# Patient Record
Sex: Female | Born: 1975 | Hispanic: No | Marital: Single | Smoking: Former smoker
Health system: Southern US, Community
[De-identification: ages and names within clinical notes are randomized; demographics above are authoritative.]

## PROBLEM LIST (undated history)

## (undated) ENCOUNTER — Inpatient Hospital Stay (HOSPITAL_COMMUNITY): Payer: Self-pay

## (undated) DIAGNOSIS — O009 Unspecified ectopic pregnancy without intrauterine pregnancy: Secondary | ICD-10-CM

## (undated) HISTORY — PX: AUGMENTATION MAMMAPLASTY: SUR837

## (undated) HISTORY — DX: Unspecified ectopic pregnancy without intrauterine pregnancy: O00.90

## (undated) HISTORY — PX: INTRAUTERINE DEVICE (IUD) INSERTION: SHX5877

---

## 2015-08-08 ENCOUNTER — Encounter: Payer: Self-pay | Admitting: Certified Nurse Midwife

## 2015-08-08 ENCOUNTER — Ambulatory Visit (INDEPENDENT_AMBULATORY_CARE_PROVIDER_SITE_OTHER): Payer: BLUE CROSS/BLUE SHIELD | Admitting: Certified Nurse Midwife

## 2015-08-08 VITALS — BP 102/60 | HR 84 | Resp 16 | Ht 65.0 in | Wt 163.0 lb

## 2015-08-08 DIAGNOSIS — Z124 Encounter for screening for malignant neoplasm of cervix: Secondary | ICD-10-CM | POA: Diagnosis not present

## 2015-08-08 DIAGNOSIS — Z0000 Encounter for general adult medical examination without abnormal findings: Secondary | ICD-10-CM | POA: Diagnosis not present

## 2015-08-08 DIAGNOSIS — Z30432 Encounter for removal of intrauterine contraceptive device: Secondary | ICD-10-CM | POA: Diagnosis not present

## 2015-08-08 DIAGNOSIS — Z01419 Encounter for gynecological examination (general) (routine) without abnormal findings: Secondary | ICD-10-CM | POA: Diagnosis not present

## 2015-08-08 DIAGNOSIS — N912 Amenorrhea, unspecified: Secondary | ICD-10-CM | POA: Diagnosis not present

## 2015-08-08 LAB — POCT URINE PREGNANCY: PREG TEST UR: NEGATIVE

## 2015-08-08 NOTE — Patient Instructions (Addendum)

## 2015-08-08 NOTE — Progress Notes (Signed)
40 y.o. G1P1001 Single  African American Fe here to establish gyn care and  for annual exam. Also would like to discuss having Mirena removed to try and get pregnant again.Blended family 38 year old(hers),38 year old, 38 year old.  Mirena inserted 2009 due for removal 2014. Patient had C -section with last pregnancy and anemia otherwise no issues. Sees Urgent care if needed. No health issues today. Fiancee with patient, patient verbally consented to his presence for all conversation, management and visit. Planning marriage soon.  No LMP recorded. Patient is not currently having periods (Reason: IUD).          Sexually active: Yes.    The current method of family planning is IUD.    Exercising: Yes.    Walk Smoker:  no  Health Maintenance: Pap:  05/2014-WNL per pt , no abnormal pap smear history MMG:  Never Colonoscopy:  Never BMD:   Never TDaP:  2002 Shingles: Never Pneumonia: Never HIV: 2016-neg Labs: Per pt does not want any.    reports that she has been smoking Cigarettes.  She has a 1.5 pack-year smoking history. She has never used smokeless tobacco. She reports that she drinks about 0.6 - 1.2 oz of alcohol per week. She reports that she does not use illicit drugs.  No past medical history on file.  Past Surgical History  Procedure Laterality Date  . Augmentation mammaplasty    . Cesarean section      Current Outpatient Prescriptions  Medication Sig Dispense Refill  . levonorgestrel (MIRENA) 20 MCG/24HR IUD 1 each by Intrauterine route once.     No current facility-administered medications for this visit.    Family History  Problem Relation Age of Onset  . Diabetes Mother   . Cancer Father     Prostate Cancer  . Prostate cancer Father   . Breast cancer Paternal Aunt   . Cancer Paternal Grandmother     Brain & Bone Cancer  . Brain cancer Paternal Grandmother   . Bone cancer Paternal Grandmother     ROS:  Pertinent items are noted in HPI.  Otherwise, a comprehensive  ROS was negative.  Exam:   BP 102/60 mmHg  Pulse 84  Resp 16  Ht  (1.651 m)  Wt 163 lb (73.936 kg)  BMI 27.12 kg/m2 Height:  (165.1 cm) Ht Readings from Last 3 Encounters:  08/08/15  (1.651 m)    General appearance: alert, cooperative and appears stated age Head: Normocephalic, without obvious abnormality, atraumatic Neck: no adenopathy, supple, symmetrical, trachea midline and thyroid normal to inspection and palpation Lungs: clear to auscultation bilaterally Breasts: normal appearance, no masses or tenderness, No nipple retraction or dimpling, No nipple discharge or bleeding, No axillary or supraclavicular adenopathy, saline implants bilateral feel intact Heart: regular rate and rhythm Abdomen: soft, non-tender; no masses,  no organomegaly Extremities: extremities normal, atraumatic, no cyanosis or edema Skin: Skin color, texture, turgor normal. No rashes or lesions Lymph nodes: Cervical, supraclavicular, and axillary nodes normal. No abnormal inguinal nodes palpated Neurologic: Grossly normal   Pelvic: External genitalia:  no lesions              Urethra:  normal appearing urethra with no masses, tenderness or lesions              Bartholin's and Skene's: normal                 Vagina: normal appearing vagina with normal color and discharge, no  lesions              Cervix: no cervical motion tenderness, no lesions and normal appearance with IUD noted in cervical os              Pap taken: Yes.   Bimanual Exam:  Uterus:  normal size, contour, position, consistency, mobility, non-tender              Adnexa: normal adnexa and no mass, fullness, tenderness               Rectovaginal: Confirms               Anus:  normal sphincter tone, no lesions  Chaperone present: yes  A:  Well Woman with normal exam  Contraception expired Mirena IUD due for removal 2014, plan to schedule for removal to try for pregnancy  History of anemia  P:   Reviewed health and wellness  pertinent to exam  Discussed condom use with IUD past due for removal Negative UPT today and will need to come in two weeks for serum HCG if negative will remove IUD. She will be notified of insurance info and scheduled as above.  Discussed importance of iron in diet with history of anemia.  Encouraged to start on Prenatal vitamins now to prepare for pregnancy. Questions addressed.  Pap smear as above with HPVHR   counseled on breast self exam, adequate intake of calcium and vitamin D, diet and exercise  return annually or prn  An After Visit Summary was printed and given to the patient.

## 2015-08-09 LAB — CBC
HCT: 39.4 % (ref 35.0–45.0)
Hemoglobin: 12.7 g/dL (ref 11.7–15.5)
MCH: 28.4 pg (ref 27.0–33.0)
MCHC: 32.2 g/dL (ref 32.0–36.0)
MCV: 88.1 fL (ref 80.0–100.0)
MPV: 10.5 fL (ref 7.5–12.5)
PLATELETS: 257 10*3/uL (ref 140–400)
RBC: 4.47 MIL/uL (ref 3.80–5.10)
RDW: 13.6 % (ref 11.0–15.0)
WBC: 7.2 10*3/uL (ref 3.8–10.8)

## 2015-08-09 LAB — IPS PAP TEST WITH HPV

## 2015-08-09 LAB — RUBELLA SCREEN: RUBELLA: 2.22 {index} — AB (ref <0.90)

## 2015-08-09 NOTE — Progress Notes (Signed)
Reviewed personally.  M. Suzanne Slaton Reaser, MD.  

## 2015-08-22 ENCOUNTER — Ambulatory Visit (INDEPENDENT_AMBULATORY_CARE_PROVIDER_SITE_OTHER): Payer: BLUE CROSS/BLUE SHIELD | Admitting: Certified Nurse Midwife

## 2015-08-22 ENCOUNTER — Encounter: Payer: Self-pay | Admitting: Certified Nurse Midwife

## 2015-08-22 VITALS — BP 112/80 | HR 72 | Resp 16 | Ht 65.0 in | Wt 163.0 lb

## 2015-08-22 DIAGNOSIS — Z30432 Encounter for removal of intrauterine contraceptive device: Secondary | ICD-10-CM

## 2015-08-22 DIAGNOSIS — N912 Amenorrhea, unspecified: Secondary | ICD-10-CM | POA: Diagnosis not present

## 2015-08-22 LAB — POCT URINE PREGNANCY: Preg Test, Ur: NEGATIVE

## 2015-08-22 NOTE — Progress Notes (Signed)
40 y.o. Married African American G1P1001here for IUD removal to attempt pregnancy, but did not come in for follow up HCG qualitative due to expired MIrena IUD. Patient missed appointment. Patient has also not been using alternative contraception until IUD removed. "Spouse does not want to use them". Last sexually activity this am. Patient request we have discussion with spouse regarding this, he is with her today. No other health concerns. Spouse present for discussion after initial interview with patient, per patient verbal request.  O: Healthy female, WD WN Affect: normal orientation X 3    A: Mirena IUD past due for removal due 02/26/12 Previous negative UPT on 08/08/15, due for repeat with HCG qualitative, did not keep appointment to assure no pregnancy prior to removal. Contraception none  P: Discussed with patient and spouse the concerns with Mirena IUD removal and possible pregnancy with no contraception use in the past two weeks. For removal to try for pregnancy will need serum HCG today and repeat in 2 weeks with stat HCG qualitative and if negative can remove later same day. They must have protection with condoms during sexual activity until IUD removed. This was stressed, along with how important it is for trust in provider patient/family relationship when reviewing history. Questions addressed. Patient and spouse agreed that they will follow plan to be able to have MIrena removed. Appointment will be scheduled as above prior to leaving today.  Lab: Serum HCG qualitative   20 minutes spent with patient with >50% of time spent in face to face counseling.  RV as above

## 2015-08-23 LAB — HCG, SERUM, QUALITATIVE: Preg, Serum: NEGATIVE

## 2015-08-23 NOTE — Progress Notes (Signed)
Reviewed personally.  M. Suzanne Carlene Bickley, MD.  

## 2015-09-07 ENCOUNTER — Ambulatory Visit (INDEPENDENT_AMBULATORY_CARE_PROVIDER_SITE_OTHER): Payer: BLUE CROSS/BLUE SHIELD | Admitting: Certified Nurse Midwife

## 2015-09-07 ENCOUNTER — Encounter: Payer: Self-pay | Admitting: Certified Nurse Midwife

## 2015-09-07 ENCOUNTER — Other Ambulatory Visit: Payer: BLUE CROSS/BLUE SHIELD

## 2015-09-07 VITALS — BP 116/64 | HR 68 | Resp 16 | Ht 65.0 in | Wt 162.0 lb

## 2015-09-07 DIAGNOSIS — Z30432 Encounter for removal of intrauterine contraceptive device: Secondary | ICD-10-CM

## 2015-09-07 DIAGNOSIS — N912 Amenorrhea, unspecified: Secondary | ICD-10-CM

## 2015-09-07 LAB — HCG, SERUM, QUALITATIVE: Preg, Serum: NEGATIVE

## 2015-09-07 NOTE — Patient Instructions (Signed)
Prenatal Care °WHAT IS PRENATAL CARE?  °Prenatal care is the process of caring for a pregnant woman before she gives birth. Prenatal care makes sure that she and her baby remain as healthy as possible throughout pregnancy. Prenatal care may be provided by a midwife, family practice health care provider, or a childbirth and pregnancy specialist (obstetrician). Prenatal care may include physical examinations, testing, treatments, and education on nutrition, lifestyle, and social support services. °WHY IS PRENATAL CARE SO IMPORTANT?  °Early and consistent prenatal care increases the chance that you and your baby will remain healthy throughout your pregnancy. This type of care also decreases a baby's risk of being born too early (prematurely), or being born smaller than expected (small for gestational age). Any underlying medical conditions you may have that could pose a risk during your pregnancy are discussed during prenatal care visits. You will also be monitored regularly for any new conditions that may arise during your pregnancy so they can be treated quickly and effectively. °WHAT HAPPENS DURING PRENATAL CARE VISITS? °Prenatal care visits may include the following: °Discussion °Tell your health care provider about any new signs or symptoms you have experienced since your last visit. These might include: °· Nausea or vomiting. °· Increased or decreased level of energy. °· Difficulty sleeping. °· Back or leg pain. °· Weight changes. °· Frequent urination. °· Shortness of breath with physical activity. °· Changes in your skin, such as the development of a rash or itchiness. °· Vaginal discharge or bleeding. °· Feelings of excitement or nervousness. °· Changes in your baby's movements. °You may want to write down any questions or topics you want to discuss with your health care provider and bring them with you to your appointment. °Examination °During your first prenatal care visit, you will likely have a complete  physical exam. Your health care provider will often examine your vagina, cervix, and the position of your uterus, as well as check your heart, lungs, and other body systems. As your pregnancy progresses, your health care provider will measure the size of your uterus and your baby's position inside your uterus. He or she may also examine you for early signs of labor. Your prenatal visits may also include checking your blood pressure and, after about 10-12 weeks of pregnancy, listening to your baby's heartbeat. °Testing °Regular testing often includes: °· Urinalysis. This checks your urine for glucose, protein, or signs of infection. °· Blood count. This checks the levels of white and red blood cells in your body. °· Tests for sexually transmitted infections (STIs). Testing for STIs at the beginning of pregnancy is routinely done and is required in many states. °· Antibody testing. You will be checked to see if you are immune to certain illnesses, such as rubella, that can affect a developing fetus. °· Glucose screen. Around 24-28 weeks of pregnancy, your blood glucose level will be checked for signs of gestational diabetes. Follow-up tests may be recommended. °· Group B strep. This is a bacteria that is commonly found inside a woman's vagina. This test will inform your health care provider if you need an antibiotic to reduce the amount of this bacteria in your body prior to labor and childbirth. °· Ultrasound. Many pregnant women undergo an ultrasound screening around 18-20 weeks of pregnancy to evaluate the health of the fetus and check for any developmental abnormalities. °· HIV (human immunodeficiency virus) testing. Early in your pregnancy, you will be screened for HIV. If you are at high risk for HIV, this test   may be repeated during your third trimester of pregnancy. °You may be offered other testing based on your age, personal or family medical history, or other factors.  °HOW OFTEN SHOULD I PLAN TO SEE MY  HEALTH CARE PROVIDER FOR PRENATAL CARE? °Your prenatal care check-up schedule depends on any medical conditions you have before, or develop during, your pregnancy. If you do not have any underlying medical conditions, you will likely be seen for checkups: °· Monthly, during the first 6 months of pregnancy. °· Twice a month during months 7 and 8 of pregnancy. °· Weekly starting in the 9th month of pregnancy and until delivery. °If you develop signs of early labor or other concerning signs or symptoms, you may need to see your health care provider more often. Ask your health care provider what prenatal care schedule is best for you. °WHAT CAN I DO TO KEEP MYSELF AND MY BABY AS HEALTHY AS POSSIBLE DURING MY PREGNANCY? °· Take a prenatal vitamin containing 400 micrograms (0.4 mg) of folic acid every day. Your health care provider may also ask you to take additional vitamins such as iodine, vitamin D, iron, copper, and zinc. °· Take 1500-2000 mg of calcium daily starting at your 20th week of pregnancy until you deliver your baby. °· Make sure you are up to date on your vaccinations. Unless directed otherwise by your health care provider: °¨ You should receive a tetanus, diphtheria, and pertussis (Tdap) vaccination between the 27th and 36th week of your pregnancy, regardless of when your last Tdap immunization occurred. This helps protect your baby from whooping cough (pertussis) after he or she is born. °¨ You should receive an annual inactivated influenza vaccine (IIV) to help protect you and your baby from influenza. This can be done at any point during your pregnancy. °· Eat a well-rounded diet that includes: °¨ Fresh fruits and vegetables. °¨ Lean proteins. °¨ Calcium-rich foods such as milk, yogurt, hard cheeses, and dark, leafy greens. °¨ Whole grain breads. °· Do not eat seafood high in mercury, including: °¨ Swordfish. °¨ Tilefish. °¨ Shark. °¨ King mackerel. °¨ More than 6 oz tuna per week. °· Do not eat: °¨ Raw  or undercooked meats or eggs. °¨ Unpasteurized foods, such as soft cheeses (brie, blue, or feta), juices, and milks. °¨ Lunch meats. °¨ Hot dogs that have not been heated until they are steaming. °· Drink enough water to keep your urine clear or pale yellow. For many women, this may be 10 or more 8 oz glasses of water each day. Keeping yourself hydrated helps deliver nutrients to your baby and may prevent the start of pre-term uterine contractions. °· Do not use any tobacco products including cigarettes, chewing tobacco, or electronic cigarettes. If you need help quitting, ask your health care provider. °· Do not drink beverages containing alcohol. No safe level of alcohol consumption during pregnancy has been determined. °· Do not use any illegal drugs. These can harm your developing baby or cause a miscarriage. °· Ask your health care provider or pharmacist before taking any prescription or over-the-counter medicines, herbs, or supplements. °· Limit your caffeine intake to no more than 200 mg per day. °· Exercise. Unless told otherwise by your health care provider, try to get 30 minutes of moderate exercise most days of the week. Do not  do high-impact activities, contact sports, or activities with a high risk of falling, such as horseback riding or downhill skiing. °· Get plenty of rest. °· Avoid anything that raises your   body temperature, such as hot tubs and saunas. °· If you own a cat, do not empty its litter box. Bacteria contained in cat feces can cause an infection called toxoplasmosis. This can result in serious harm to the fetus. °· Stay away from chemicals such as insecticides, lead, mercury, and cleaning or paint products that contain solvents. °· Do not have any X-rays taken unless medically necessary. °· Take a childbirth and breastfeeding preparation class. Ask your health care provider if you need a referral or recommendation. °  °This information is not intended to replace advice given to you by  your health care provider. Make sure you discuss any questions you have with your health care provider. °  °Document Released: 02/14/2003 Document Revised: 03/04/2014 Document Reviewed: 04/28/2013 °Elsevier Interactive Patient Education ©2016 [employer] ° °

## 2015-09-07 NOTE — Progress Notes (Signed)
40 yrs African American Single G1P1001 female presents for Mirena IUD removal.  Plans for contraception are  none, plans to try for pregnancy  LMP none in 3 years    2 serum HCG 2 weeks apart negative, with spouse with condom use.    HPI neg.  Exam: Healthy WDWN female Abdomen: soft non-tender Groin:no inguinal nodes palpated    Pelvic exam:Pelvic exam: VULVA: normal appearing vulva with no masses, tenderness or lesions, VAGINA: normal appearing vagina with normal color and discharge, no lesions, CERVIX: normal appearing cervix without discharge or lesions, IUD noted in cervical os, UTERUS: uterus is normal size, shape, consistency and nontender, ADNEXA: normal adnexa in size, nontender and no masses.  Procedure: Speculum placed, cervix visualized.  IUD string visualized, grasp with ring forceps, with gentle traction IUD removed intact.  IUD shown to patient and discarded. Speculum removed.   Assessment:Mirena removal Pt tolerated procedure well.  Plan: Continue prenatal vitamins and if no pregnancy in 6 months needs to advise.Has planning for pregnancy handout.    Questions addressed  Rv prn or positive pregnancy test

## 2015-09-11 NOTE — Progress Notes (Signed)
Encounter reviewed Jevonte Clanton, MD   

## 2016-02-26 NOTE — L&D Delivery Note (Signed)
Delivery Note SROM for blood tinged fluid. Cytotec loading dose given. Pt received CLE and felt pressure immediately after. A nonviable female infant Teacher, adult education) was noted at introitus, breech. Placenta delivered with gentle subrapubic pressure, intact. Bimanual exam with old scar intact and cervix already closing. No vaginal or cervical lacerations noted. Placenta to pathology - cultures taken and sent. Patient declines autopsy. No clinical evidence of maternal chorio thus abx discontinued. EBL 200cc. Plan for one more dose of Cytotec 3 hours after LD prophylactically. Chaplain at bedside, pt desires to hold [name] once she is dressed.    Ranae Pila 10/15/2016, 9:28 AM

## 2016-06-14 ENCOUNTER — Telehealth: Payer: Self-pay | Admitting: Certified Nurse Midwife

## 2016-06-14 NOTE — Telephone Encounter (Signed)
Patient called to report that she is pregnant. Patient declined a pregnancy confirmation appointment. I gave patient names and numbers of OBGYN's.

## 2016-07-09 ENCOUNTER — Ambulatory Visit: Payer: BLUE CROSS/BLUE SHIELD | Admitting: Certified Nurse Midwife

## 2016-07-27 ENCOUNTER — Inpatient Hospital Stay (HOSPITAL_COMMUNITY)
Admission: AD | Admit: 2016-07-27 | Discharge: 2016-07-27 | Disposition: A | Discharge status: Hospice | Payer: BLUE CROSS/BLUE SHIELD | Source: Ambulatory Visit | Attending: Obstetrics and Gynecology | Admitting: Obstetrics and Gynecology

## 2016-07-27 ENCOUNTER — Encounter (HOSPITAL_COMMUNITY): Payer: Self-pay

## 2016-07-27 DIAGNOSIS — Z3A10 10 weeks gestation of pregnancy: Secondary | ICD-10-CM | POA: Insufficient documentation

## 2016-07-27 DIAGNOSIS — Z833 Family history of diabetes mellitus: Secondary | ICD-10-CM | POA: Insufficient documentation

## 2016-07-27 DIAGNOSIS — O26891 Other specified pregnancy related conditions, first trimester: Secondary | ICD-10-CM | POA: Insufficient documentation

## 2016-07-27 DIAGNOSIS — R1030 Lower abdominal pain, unspecified: Secondary | ICD-10-CM | POA: Insufficient documentation

## 2016-07-27 DIAGNOSIS — O219 Vomiting of pregnancy, unspecified: Secondary | ICD-10-CM | POA: Diagnosis not present

## 2016-07-27 DIAGNOSIS — O34219 Maternal care for unspecified type scar from previous cesarean delivery: Secondary | ICD-10-CM | POA: Diagnosis not present

## 2016-07-27 DIAGNOSIS — R112 Nausea with vomiting, unspecified: Secondary | ICD-10-CM | POA: Diagnosis present

## 2016-07-27 LAB — BASIC METABOLIC PANEL
ANION GAP: 6 (ref 5–15)
BUN: 9 mg/dL (ref 6–20)
CO2: 27 mmol/L (ref 22–32)
Calcium: 8.7 mg/dL — ABNORMAL LOW (ref 8.9–10.3)
Chloride: 102 mmol/L (ref 101–111)
Creatinine, Ser: 0.64 mg/dL (ref 0.44–1.00)
GFR calc Af Amer: >60 mL/min (ref >60)
Glucose, Bld: 98 mg/dL (ref 65–99)
Potassium: 3.4 mmol/L — ABNORMAL LOW (ref 3.5–5.1)
SODIUM: 135 mmol/L (ref 135–145)

## 2016-07-27 LAB — URINALYSIS, ROUTINE W REFLEX MICROSCOPIC
Bilirubin Urine: NEGATIVE
GLUCOSE, UA: NEGATIVE mg/dL
HGB URINE DIPSTICK: NEGATIVE
Ketones, ur: NEGATIVE mg/dL
NITRITE: NEGATIVE
PH: 8 (ref 5.0–8.0)
PROTEIN: 30 mg/dL — AB
SPECIFIC GRAVITY, URINE: 1.027 (ref 1.005–1.030)

## 2016-07-27 LAB — CBC
HCT: 35 % — ABNORMAL LOW (ref 36.0–46.0)
HEMOGLOBIN: 11.9 g/dL — AB (ref 12.0–15.0)
MCH: 28.8 pg (ref 26.0–34.0)
MCHC: 34 g/dL (ref 30.0–36.0)
MCV: 84.7 fL (ref 78.0–100.0)
PLATELETS: 231 10*3/uL (ref 150–400)
RBC: 4.13 MIL/uL (ref 3.87–5.11)
RDW: 14.1 % (ref 11.5–15.5)
WBC: 8 10*3/uL (ref 4.0–10.5)

## 2016-07-27 LAB — POCT PREGNANCY, URINE: Preg Test, Ur: POSITIVE — AB

## 2016-07-27 MED ORDER — PROMETHAZINE HCL 25 MG/ML IJ SOLN
25.0000 mg | Freq: Once | INTRAMUSCULAR | Status: AC
  Administered 2016-07-27: 25 mg via INTRAVENOUS
  Filled 2016-07-27: qty 1

## 2016-07-27 MED ORDER — RANITIDINE HCL 150 MG PO TABS: 150.0000 mg | ORAL_TABLET | Freq: Two times a day (BID) | ORAL | 0 refills | Status: DC

## 2016-07-27 MED ORDER — LACTATED RINGERS IV BOLUS (SEPSIS)
1000.0000 mL | Freq: Once | INTRAVENOUS | Status: AC
  Administered 2016-07-27: 1000 mL via INTRAVENOUS

## 2016-07-27 MED ORDER — PROMETHAZINE HCL 25 MG PO TABS: 25.0000 mg | ORAL_TABLET | Freq: Four times a day (QID) | ORAL | 0 refills | Status: DC | PRN

## 2016-07-27 MED ORDER — FAMOTIDINE IN NACL 20-0.9 MG/50ML-% IV SOLN
20.0000 mg | Freq: Once | INTRAVENOUS | Status: AC
  Administered 2016-07-27: 20 mg via INTRAVENOUS
  Filled 2016-07-27: qty 50

## 2016-07-27 NOTE — MAU Provider Note (Signed)
History     CSN: [id number]  Arrival date and time: 07/27/16 1448  First Provider Initiated Contact with Patient 07/27/16 1539      Chief Complaint  Patient presents with  . Emesis  . Abdominal Pain   HPI  Monique Wood is a 41 y.o. G2P1001 at [redacted]w[redacted]d who presents with nausea & vomiting. Has been taking diclegis at home without relief. Symptoms have worsened in the last 4 days & pt hasn't taken diclegis since Thursday. Reports vomiting 6+ times today. Had orange juice, PB&J, & pancakes; all of it came up. Also reports lower abdominal pain that she states feels like soreness from vomiting. Was seen in office 2 weeks ago; had ultrasound that confirmed IUP & gave her EDD of 02/23/17.  Denies fever, vaginal bleeding, diarrhea, constipation. Last BM was this morning.   OB History    Gravida Para Term Preterm AB Living   2 1 1     1    SAB TAB Ectopic Multiple Live Births           1      History reviewed. No pertinent past medical history.  Past Surgical History:  Procedure Laterality Date  . AUGMENTATION MAMMAPLASTY    . CESAREAN SECTION    . INTRAUTERINE DEVICE (IUD) INSERTION      Family History  Problem Relation Age of Onset  . Diabetes Mother   . Cancer Father        Prostate Cancer  . Prostate cancer Father   . Breast cancer Paternal Aunt   . Cancer Paternal Grandmother        Brain & Bone Cancer  . Brain cancer Paternal Grandmother   . Bone cancer Paternal Grandmother     Social History  Substance Use Topics  . Smoking status: Never Smoker  . Smokeless tobacco: Never Used  . Alcohol use 0.6 - 1.2 oz/week    1 - 2 Standard drinks or equivalent per week     Comment: Wine    Allergies: No Known Allergies  Prescriptions Prior to Admission  Medication Sig Dispense Refill Last Dose  . DICLEGIS 10-10 MG TBEC      . [DISCONTINUED] levonorgestrel (MIRENA) 20 MCG/24HR IUD 1 each by Intrauterine route once. Reported on 09/07/2015   Not Taking    Review of Systems   Constitutional: Negative.   Gastrointestinal: Positive for abdominal pain, nausea and vomiting. Negative for constipation and diarrhea.  Genitourinary: Negative.    Physical Exam   Blood pressure 115/61, pulse 85, temperature 98.5 F (36.9 C), temperature source Oral, resp. rate 16, SpO2 99 %.  Physical Exam  Nursing note and vitals reviewed. Constitutional: She is oriented to person, place, and time. She appears well-developed and well-nourished. No distress.  HENT:  Head: Normocephalic and atraumatic.  Eyes: Conjunctivae are normal. Right eye exhibits no discharge. Left eye exhibits no discharge. No scleral icterus.  Neck: Normal range of motion.  Cardiovascular: Normal rate, regular rhythm and normal heart sounds.   No murmur heard. Respiratory: Effort normal and breath sounds normal. No respiratory distress. She has no wheezes.  GI: Soft. Bowel sounds are normal. She exhibits distension. There is no tenderness. There is no rebound and no guarding.  Neurological: She is alert and oriented to person, place, and time.  Skin: Skin is warm and dry. She is not diaphoretic.  Psychiatric: She has a normal mood and affect. Her behavior is normal. Judgment and thought content normal.    MAU Course  Procedures Results for orders placed or performed during the hospital encounter of 07/27/16 (from the past 24 hour(s))  Urinalysis, Routine w reflex microscopic     Status: Abnormal   Collection Time: 07/27/16  2:56 PM  Result Value Ref Range   Color, Urine YELLOW YELLOW   APPearance HAZY (A) CLEAR   Specific Gravity, Urine 1.027 1.005 - 1.030   pH 8.0 5.0 - 8.0   Glucose, UA NEGATIVE NEGATIVE mg/dL   Hgb urine dipstick NEGATIVE NEGATIVE   Bilirubin Urine NEGATIVE NEGATIVE   Ketones, ur NEGATIVE NEGATIVE mg/dL   Protein, ur 30 (A) NEGATIVE mg/dL   Nitrite NEGATIVE NEGATIVE   Leukocytes, UA TRACE (A) NEGATIVE   RBC / HPF 0-5 0 - 5 RBC/hpf   WBC, UA 0-5 0 - 5 WBC/hpf   Bacteria, UA  RARE (A) NONE SEEN   Squamous Epithelial / LPF 0-5 (A) NONE SEEN   Mucous PRESENT   Pregnancy, urine POC     Status: Abnormal   Collection Time: 07/27/16  3:14 PM  Result Value Ref Range   Preg Test, Ur POSITIVE (A) NEGATIVE  CBC     Status: Abnormal   Collection Time: 07/27/16  4:12 PM  Result Value Ref Range   WBC 8.0 4.0 - 10.5 K/uL   RBC 4.13 3.87 - 5.11 MIL/uL   Hemoglobin 11.9 (L) 12.0 - 15.0 g/dL   HCT 16.135.0 (L) 09.636.0 - 04.546.0 %   MCV 84.7 78.0 - 100.0 fL   MCH 28.8 26.0 - 34.0 pg   MCHC 34.0 30.0 - 36.0 g/dL   RDW 40.914.1 81.111.5 - 91.415.5 %   Platelets 231 150 - 400 K/uL  Basic metabolic panel     Status: Abnormal   Collection Time: 07/27/16  4:12 PM  Result Value Ref Range   Sodium 135 135 - 145 mmol/L   Potassium 3.4 (L) 3.5 - 5.1 mmol/L   Chloride 102 101 - 111 mmol/L   CO2 27 22 - 32 mmol/L   Glucose, Bld 98 65 - 99 mg/dL   BUN 9 6 - 20 mg/dL   Creatinine, Ser 7.820.64 0.44 - 1.00 mg/dL   Calcium 8.7 (L) 8.9 - 10.3 mg/dL   GFR calc non Af Amer >60 >60 mL/min   GFR calc Af Amer >60 >60 mL/min   Anion gap 6 5 - 15    MDM FHT 158 by informal bedside ultrasound IV LR bolus, phenergan 25 mg, pepcid 20 mg CBC, BMP Patient reports improvement in symptoms; not observed vomiting while in MAU S/w Dr. Rana SnareLowe. Ok to discharge home.  Assessment and Plan  A; 1. Nausea and vomiting during pregnancy prior to [redacted] weeks gestation    P: Discharge home Rx phenergan Keep scheduled appt with OB on Monday Discussed reasons to return to MAU  [name] 07/27/2016, 3:39 PM

## 2016-07-27 NOTE — MAU Note (Signed)
Ongoing vomiting, getting much worse.  Also having pain in lower abd, more to rt side.  At first was only when she vomited, now it is all the time

## 2016-07-27 NOTE — Progress Notes (Addendum)
G2P1 @ [redacted] wksga. Presents to triage for throwing up all day today. Doppler attempted unable to obtain fhr. Denies bleeding. C/o abdominal pain 4 days ago. States from vomiting. VSS see flow sheet for details.   1530: Provider aware of pt status.   1623: back from lunch. New orders received for labs and IV hydration and meds.  1640: IV started. LR bolus per order. Pt states labs came by earlier.   1648: Medicated per order.   1716: pt resting quietly. States phenergan helped with nausea.   1719: Provider at bs re-assessing pt.

## 2016-07-27 NOTE — Discharge Instructions (Signed)
Eating Plan for Nausea & vomiting in pregnancy  Hyperemesis gravidarum is a severe form of morning sickness. Because this condition causes severe nausea and vomiting, it can lead to dehydration, malnutrition, and weight loss. One way to lessen the symptoms of nausea and vomiting is to follow the eating plan for hyperemesis gravidarum. It is often used along with prescribed medicines to control your symptoms. What can I do to relieve my symptoms? Listen to your body. Everyone is different and has different preferences. Find what works best for you. Take any of the following actions that are helpful to you:  Eat and drink slowly.  Eat 5-6 small meals daily instead of 3 large meals.  Eat crackers before you get out of bed in the morning.  Try having a snack in the middle of the night.  Starchy foods are usually tolerated well. Examples include cereal, toast, bread, potatoes, pasta, rice, and pretzels.  Ginger may help with nausea. Add  tsp ground ginger to hot tea or choose ginger tea.  Try drinking 100% fruit juice or an electrolyte drink. An electrolyte drink contains sodium, potassium, and chloride.  Continue to take your prenatal vitamins as told by your health care provider. If you are having trouble taking your prenatal vitamins, talk with your health care provider about different options.  Include at least 1 serving of protein with your meals and snacks. Protein options include meats or poultry, beans, nuts, eggs, and yogurt. Try eating a protein-rich snack before bed. Examples of these snacks include cheese and crackers or half of a peanut butter or [place] sandwich.  Consider eliminating foods that trigger your symptoms. These may include spicy foods, coffee, high-fat foods, very sweet foods, and acidic foods.  Try meals that have more protein combined with bland, salty, lower-fat, and dry foods, such as nuts, seeds, pretzels, crackers, and cereal.  Talk with your healthcare  provider about starting a supplement of vitamin B6.  Have fluids that are cold, clear, and carbonated or sour. Examples include lemonade, ginger ale, lemon-lime soda, ice water, and sparkling water.  Try lemon or mint tea.  Try brushing your teeth or using a mouth rinse after meals.  What should I avoid to reduce my symptoms? Avoiding some of the following things may help reduce your symptoms.  Foods with strong smells. Try eating meals in well-ventilated areas that are free of odors.  Drinking water or other beverages with meals. Try not to drink anything during the 30 minutes before and after your meals.  Drinking more than 1 cup of fluid at a time. Sometimes using a straw helps.  Fried or high-fat foods, such as butter and cream sauces.  Spicy foods.  Skipping meals as best as you can. Nausea can be more intense on an empty stomach. If you cannot tolerate food at that time, do not force it. Try sucking on ice chips or other frozen items, and make up for missed calories later.  Lying down within 2 hours after eating.  Environmental triggers. These may include smoky rooms, closed spaces, rooms with strong smells, warm or humid places, overly loud and noisy rooms, and rooms with motion or flickering lights.  Quick and sudden changes in your movement.  This information is not intended to replace advice given to you by your health care provider. Make sure you discuss any questions you have with your health care provider. Document Released: 12/09/2006 Document Revised: 10/11/2015 Document Reviewed: 09/12/2015 Elsevier Interactive Patient Education  [employer]2018 Elsevier Inc.

## 2016-10-13 ENCOUNTER — Inpatient Hospital Stay (HOSPITAL_COMMUNITY)
Admission: AD | Admit: 2016-10-13 | Discharge: 2016-10-16 | DRG: 775 | Disposition: A | Discharge status: Home / Self Care | Payer: BLUE CROSS/BLUE SHIELD | Source: Ambulatory Visit | Attending: Obstetrics and Gynecology | Admitting: Obstetrics and Gynecology

## 2016-10-13 ENCOUNTER — Inpatient Hospital Stay (HOSPITAL_COMMUNITY): Payer: BLUE CROSS/BLUE SHIELD

## 2016-10-13 ENCOUNTER — Encounter (HOSPITAL_COMMUNITY): Payer: Self-pay | Admitting: *Deleted

## 2016-10-13 DIAGNOSIS — O364XX0 Maternal care for intrauterine death, not applicable or unspecified: Secondary | ICD-10-CM | POA: Diagnosis present

## 2016-10-13 DIAGNOSIS — O42912 Preterm premature rupture of membranes, unspecified as to length of time between rupture and onset of labor, second trimester: Secondary | ICD-10-CM

## 2016-10-13 DIAGNOSIS — O411220 Chorioamnionitis, second trimester, not applicable or unspecified: Secondary | ICD-10-CM | POA: Diagnosis present

## 2016-10-13 DIAGNOSIS — O26892 Other specified pregnancy related conditions, second trimester: Secondary | ICD-10-CM

## 2016-10-13 DIAGNOSIS — Z3A21 21 weeks gestation of pregnancy: Secondary | ICD-10-CM

## 2016-10-13 DIAGNOSIS — O3432 Maternal care for cervical incompetence, second trimester: Principal | ICD-10-CM | POA: Diagnosis present

## 2016-10-13 DIAGNOSIS — O321XX0 Maternal care for breech presentation, not applicable or unspecified: Secondary | ICD-10-CM | POA: Diagnosis present

## 2016-10-13 DIAGNOSIS — R102 Pelvic and perineal pain: Secondary | ICD-10-CM

## 2016-10-13 DIAGNOSIS — O26852 Spotting complicating pregnancy, second trimester: Secondary | ICD-10-CM | POA: Diagnosis present

## 2016-10-13 DIAGNOSIS — O4290 Premature rupture of membranes, unspecified as to length of time between rupture and onset of labor, unspecified weeks of gestation: Secondary | ICD-10-CM | POA: Diagnosis not present

## 2016-10-13 LAB — URINALYSIS, ROUTINE W REFLEX MICROSCOPIC
BILIRUBIN URINE: NEGATIVE
Glucose, UA: NEGATIVE mg/dL
HGB URINE DIPSTICK: NEGATIVE
Ketones, ur: NEGATIVE mg/dL
NITRITE: NEGATIVE
PROTEIN: 30 mg/dL — AB
Specific Gravity, Urine: 1.027 (ref 1.005–1.030)
pH: 6 (ref 5.0–8.0)

## 2016-10-13 LAB — CBC
HEMATOCRIT: 29.6 % — AB (ref 36.0–46.0)
Hemoglobin: 10.3 g/dL — ABNORMAL LOW (ref 12.0–15.0)
MCH: 29.4 pg (ref 26.0–34.0)
MCHC: 34.8 g/dL (ref 30.0–36.0)
MCV: 84.6 fL (ref 78.0–100.0)
PLATELETS: 239 10*3/uL (ref 150–400)
RBC: 3.5 MIL/uL — ABNORMAL LOW (ref 3.87–5.11)
RDW: 14.6 % (ref 11.5–15.5)
WBC: 9.1 10*3/uL (ref 4.0–10.5)

## 2016-10-13 LAB — TYPE AND SCREEN
ABO/RH(D): A POS
ANTIBODY SCREEN: NEGATIVE

## 2016-10-13 LAB — ABO/RH: ABO/RH(D): A POS

## 2016-10-13 LAB — AMNISURE RUPTURE OF MEMBRANE (ROM) NOT AT ARMC: Amnisure ROM: NEGATIVE

## 2016-10-13 MED ORDER — PROMETHAZINE HCL 25 MG PO TABS
25.0000 mg | ORAL_TABLET | ORAL | Status: DC | PRN
  Administered 2016-10-13: 25 mg via ORAL
  Filled 2016-10-13: qty 1

## 2016-10-13 MED ORDER — PRENATAL MULTIVITAMIN CH
1.0000 | ORAL_TABLET | Freq: Every day | ORAL | Status: DC
  Administered 2016-10-13: 1 via ORAL
  Filled 2016-10-13 (×2): qty 1

## 2016-10-13 MED ORDER — ACETAMINOPHEN 325 MG PO TABS: 650.0000 mg | ORAL_TABLET | ORAL | Status: DC | PRN

## 2016-10-13 MED ORDER — ZOLPIDEM TARTRATE 5 MG PO TABS: 5.0000 mg | ORAL_TABLET | Freq: Every evening | ORAL | Status: DC | PRN

## 2016-10-13 MED ORDER — DOCUSATE SODIUM 100 MG PO CAPS
100.0000 mg | ORAL_CAPSULE | Freq: Every day | ORAL | Status: DC
  Administered 2016-10-13: 100 mg via ORAL
  Filled 2016-10-13 (×2): qty 1

## 2016-10-13 MED ORDER — CALCIUM CARBONATE ANTACID 500 MG PO CHEW: 2.0000 | CHEWABLE_TABLET | ORAL | Status: DC | PRN

## 2016-10-13 NOTE — Progress Notes (Signed)
This RN caleed to pt's. Room.  Pt. Stated " I felt a gush of fluid like I was wetting on myself, but I know I was not "peeing."  FHT assessed via doppler 147.  Pt states she feels baby moving.  Pt. c/o of menstrual like cramping.  No vaginal bleeding noted.  Pateints panties were wet with a slight brown tinged fluid.  Dr. Marcelle Overlie notified and orders received.

## 2016-10-13 NOTE — MAU Note (Signed)
All day Sat had some brown mucous on tissue each time went to BR. Sat night started having lower abd cramping which continues.

## 2016-10-13 NOTE — Progress Notes (Signed)
Type & Screen collected.

## 2016-10-13 NOTE — Progress Notes (Signed)
[redacted]w[redacted]d  S// pt resting in T-berg  O//BP 114/60 (BP Location: Right Arm)   Pulse 93   Temp 98 F (36.7 C) (Oral)   Resp 20   Ht 5\' 5"  (1.651 m)   Wt 185 lb (83.9 kg)   SpO2 98%   BMI 30.79 kg/m   FHR stable  A+P// preterm cx dilitation, incompt cx  Spotting has stopped>>reviewed plan for [place] + MFM consult in am to assess poss of emerg cerclage

## 2016-10-13 NOTE — H&P (Signed)
Monique Wood is a 41 y.o. female presenting for incr pelvic pressure and spotting>>eval in MAU with US showed cx dilated 1.8cm with intact membr bulging into vag. OB History    Gravida Para Term Preterm AB Living   2 1 1     1    SAB TAB Ectopic Multiple Live Births           1     History reviewed. No pertinent past medical history. Past Surgical History:  Procedure Laterality Date  . AUGMENTATION MAMMAPLASTY    . CESAREAN SECTION    . INTRAUTERINE DEVICE (IUD) INSERTION     Family History: family history includes Bone cancer in her paternal grandmother; Brain cancer in her paternal grandmother; Breast cancer in her paternal aunt; Cancer in her father and paternal grandmother; Diabetes in her mother; Prostate cancer in her father. Social History:  reports that she has never smoked. She has never used smokeless tobacco. She reports that she drinks about 0.6 - 1.2 oz of alcohol per week . She reports that she does not use drugs.     Maternal Diabetes: No Genetic Screening: Normal Maternal Ultrasounds/Referrals: Normal Fetal Ultrasounds or other Referrals:  None Maternal Substance Abuse:  No Significant Maternal Medications:  None Significant Maternal Lab Results:  None Other Comments:  None  ROS History Dilation: Fingertip Effacement (%): 50 Exam by:: Monique Wood, cnm Blood pressure 114/60, pulse 93, temperature 98 F (36.7 C), temperature source Oral, resp. rate 20, height 5\' 5"  (1.651 Wood), weight 185 lb (83.9 kg), SpO2 98 %. Exam Physical Exam  Constitutional: She is oriented to person, place, and time. She appears well-developed and well-nourished.  HENT:  Head: Normocephalic and atraumatic.  Neck: Normal range of motion. Neck supple.  Cardiovascular: Normal rate and regular rhythm.   Respiratory: Effort normal and breath sounds normal.  GI:  Fundus soft, FHR 140  Genitourinary:  Genitourinary Comments: Hourglass membranes by [place]  Musculoskeletal: Normal range of motion.   Neurological: She is alert and oriented to person, place, and time.    Prenatal labs: ABO, Rh: --/--/A POS (08/19 0255) Antibody: NEG (08/19 0255) Rubella:   RPR:    HBsAg:    HIV:    GBS:     Assessment/Plan: [redacted]w[redacted]d cx dilitation with hourglassed membranes at os Adm for T-berg>>f/u US to determine poss of emergent cerclage   Monique Wood 10/13/2016, 4:44 AM

## 2016-10-13 NOTE — Progress Notes (Signed)
Transported pt via stretcher in trendelenburg position to rm 315, report given to [employer] rn

## 2016-10-13 NOTE — Progress Notes (Signed)
CTSP re leaking fluid, 1 mod gush then no more  Amnisure NEG  US>>active fetus, AFI looks wnl  Gentle digital exam>>small protrusion of memr at os, dilated ~1 cm, under NO pressure  Will maintain T berg  >>MFM consult/US in am

## 2016-10-13 NOTE — Progress Notes (Signed)
Amniosure performed per MD orders.  Patient tolerated without difficulty.

## 2016-10-13 NOTE — MAU Provider Note (Signed)
History   [id number]   Chief Complaint  Patient presents with  . Abdominal Pain    HPI Monique Wood is a 41 y.o. female  G2P1001 at [redacted]w[redacted]d IUP here with report of brown mucus on tissue when wiping while going to the bathroom yesterday.  Denies bright red bleeding.  +lower pelvic cramping that began yesterday.  Pain is rated a 8/10.  Concerned due to the amount of walking at work and visualization of discharge.    No LMP recorded. Patient is pregnant.  OB History  Gravida Para Term Preterm AB Living  2 1 1     1   SAB TAB Ectopic Multiple Live Births          1    # Outcome Date GA Lbr Len/2nd Weight Sex Delivery Anes PTL Lv  2 Current           1 Term      CS-Unspec   LIV      History reviewed. No pertinent past medical history.  Family History  Problem Relation Age of Onset  . Diabetes Mother   . Cancer Father        Prostate Cancer  . Prostate cancer Father   . Breast cancer Paternal Aunt   . Cancer Paternal Grandmother        Brain & Bone Cancer  . Brain cancer Paternal Grandmother   . Bone cancer Paternal Grandmother     Social History   Social History  . Marital status: Single    Spouse name: N/A  . Number of children: N/A  . Years of education: N/A   Social History Main Topics  . Smoking status: Never Smoker  . Smokeless tobacco: Never Used  . Alcohol use 0.6 - 1.2 oz/week    1 - 2 Standard drinks or equivalent per week     Comment: Wine  . Drug use: No  . Sexual activity: Yes    Partners: Male    Birth control/ protection: IUD   Other Topics Concern  . None   Social History Narrative  . None    No Known Allergies  No current facility-administered medications on file prior to encounter.    Current Outpatient Prescriptions on File Prior to Encounter  Medication Sig Dispense Refill  . DICLEGIS 10-10 MG TBEC Take 2 tablets by mouth at bedtime as needed (for nausea).     . Multiple Vitamin (MULTIVITAMIN WITH MINERALS) TABS tablet Take 1 tablet  by mouth daily.    . promethazine (PHENERGAN) 25 MG tablet Take 1 tablet (25 mg total) by mouth every 6 (six) hours as needed for nausea or vomiting. 30 tablet 0  . ranitidine (ZANTAC) 150 MG tablet Take 1 tablet (150 mg total) by mouth 2 (two) times daily. 60 tablet 0     Review of Systems  Constitutional: Negative for chills and fever.  Gastrointestinal: Negative for blood in stool, diarrhea, nausea and vomiting.  Genitourinary: Positive for pelvic pain (cramping) and vaginal discharge. Negative for vaginal bleeding and vaginal pain.  Neurological: Negative for headaches.     Physical Exam   Vitals:   10/13/16 0052  BP: 112/60  Pulse: 90  Resp: 20  Temp: 98.2 F (36.8 C)  Weight: 185 lb (83.9 kg)  Height: 5\' 5"  (1.651 m)    Physical Exam  Constitutional: She is oriented to person, place, and time. She appears well-developed and well-nourished.  HENT:  Head: Normocephalic.  Neck: Normal range of motion. Neck supple.  Cardiovascular: Normal rate, regular rhythm and normal heart sounds.   Respiratory: Effort normal and breath sounds normal. No respiratory distress.  GI: Soft. There is no tenderness.  Genitourinary: No bleeding in the vagina. Vaginal discharge (mucusy) found.  Musculoskeletal: Normal range of motion. She exhibits no edema.  Neurological: She is alert and oriented to person, place, and time.  Skin: Skin is warm and dry.   Cervix 0.5 cm/50% Margarita Mail, CNM MAU Course  Procedures  Results for orders placed or performed during the hospital encounter of 10/13/16 (from the past 24 hour(s))  Urinalysis, Routine w reflex microscopic     Status: Abnormal   Collection Time: 10/13/16  1:00 AM  Result Value Ref Range   Color, Urine YELLOW YELLOW   APPearance CLEAR CLEAR   Specific Gravity, Urine 1.027 1.005 - 1.030   pH 6.0 5.0 - 8.0   Glucose, UA NEGATIVE NEGATIVE mg/dL   Hgb urine dipstick NEGATIVE NEGATIVE   Bilirubin Urine NEGATIVE NEGATIVE   Ketones, ur  NEGATIVE NEGATIVE mg/dL   Protein, ur 30 (A) NEGATIVE mg/dL   Nitrite NEGATIVE NEGATIVE   Leukocytes, UA TRACE (A) NEGATIVE   RBC / HPF 0-5 0 - 5 RBC/hpf   WBC, UA 0-5 0 - 5 WBC/hpf   Bacteria, UA RARE (A) NONE SEEN   Squamous Epithelial / LPF 0-5 (A) NONE SEEN   Mucous PRESENT     MDM [place] for cervical length Preliminary [place] Results: Hourglass membranes; cervix 1.8 cm dilated  0200 Dr. Marcelle Overlie notified regarding patient HPI/exam > admit to 3rd floor, reg diet  Assessment and Plan  41 y.o. G2P1001 at [redacted]w[redacted]d IUP  Cervical Insufficiency Hourglass Membranes  Plan: Admit to HROB Unit Trendelenburg Position Regular Diet  Marlis Edelson, CNM 10/13/2016 2:24 AM

## 2016-10-13 NOTE — Progress Notes (Signed)
   10/13/16 0259  Vital Signs  BP 114/60  BP Location Right Arm  Patient Position (if appropriate) Lying  BP Method Automatic  Pulse Rate 93  Pulse Rate Source Dinamap  Resp 20  Temp 98 F (36.7 C)  Temp Source Oral  Oxygen Therapy  SpO2 98 %  Received 42 year old patient G 2 P1 from MAU. Pt alert, oriented x 4, but appears sad with a flat affect. Patient reported having intermittent abdominal cramping 8/10, brown vaginal discharge since Saturday morning while shopping at [facility]. Pt was place on bedrest in trendelenburg position. Patient was oriented to room, call bell in close reach.Pt was informed that we will be using bedpan until MD says otherwise. Pt verbalized understanding of same. We will continue to monitor.

## 2016-10-14 ENCOUNTER — Inpatient Hospital Stay (HOSPITAL_COMMUNITY): Payer: BLUE CROSS/BLUE SHIELD

## 2016-10-14 MED ORDER — LACTATED RINGERS IV SOLN
INTRAVENOUS | Status: DC
  Administered 2016-10-14: via INTRAVENOUS

## 2016-10-14 MED ORDER — SODIUM CHLORIDE 0.9 % IV SOLN
3.0000 g | Freq: Four times a day (QID) | INTRAVENOUS | Status: DC
  Administered 2016-10-14 – 2016-10-15 (×4): 3 g via INTRAVENOUS
  Filled 2016-10-14 (×7): qty 3

## 2016-10-14 NOTE — Progress Notes (Signed)
Bedside ultrasound revealed fetal legs in the vagina No cord prolapse noted FHR is 111  Discussed with patient and her husband that I think delivery is imminent  Still appreciate MFM consult this morning - Dr. Ezzard Standing - to answer questions the family has.  Will start unasyn for antibiotic prophylaxis.

## 2016-10-14 NOTE — Consult Note (Signed)
I went to see Monique Wood after reading her [place] from earlier this AM and just now, at the request of her doctor. She is a 41 yo P0 at 21+1 weeks who presented with vaginal bleeding and contractions. US done earlier this AM showed membranes to the external os. Just now scan shows prolapsing membranes with fetal parts in the vaginal. This is consistent with her clinical exam. She currently has no objective signs of infection. She is afebrile, and the uterus is nontender to my exam. We had a frank discussion about her US findings and her clinical situation. The prognosis is grim, and I currently have no interventions to offer her to attempt to prolong her pregnancy. I discussed the very high risk and high likelihood of developing chorioamnionitis and the need for delivery if that occurs. For her next pregnacy early evaluation and intervention with possible cerclage and definite use of 17 hydroxyprogesterone was also discussed. I am sorry that I do not have any interventions to offer the patient in this very unfortunate circumstance

## 2016-10-14 NOTE — Progress Notes (Signed)
Patient now in L and D room 161  Spoke to patient and her mother in law and her husband  Cervical incompetence discussed with patient at length  Discussed with patient that I would like to get a bedside ultrasound to evaluate cervix and to see if any fetal parts in prolapsing membranes.  Patient questioned me if there was anything I can do to keep baby in.  I doubt that emergency cerclage would be effective and PPROM would be very likely   I am also concerned that patient may have an infection because of obvious smell of chorioamnionitis in patient's room

## 2016-10-14 NOTE — Progress Notes (Signed)
I've visited with Geoffery Spruce and Cristal Deer as well as Christopher's mother Bjorn Loser throughout the day today.  I introduced spiritual care services and offered support as they learned this morning that their daughter Meredith Mody will likely be delivered soon and has not reached the age of viability.  They have experienced anger and sadness as they try to make sense of this news.  The couple have three older children, two are Christopher's from a previous relationship and they have a daughter together [name] 10.  They spent a year trying to get pregnant and the baby has had a healthy pregnancy.  The couple had some recent stress in their marriage and Bjorn Loser is worried that they'll both assume responsibility for the early birth as someone mentioned that stress may have been a factor in her incompetent cervix.  Shaquan has been open about her feelings and is grieving appropriately.  She's been tearful with me, but occassionally angry with staff as she tries to navigate her grief.  She is currently able to feel Christina's feet moving outside her cervix, which is simultaneously joyful and emotionally painful.  She has not yet begun to experience contractions and continues to pray that if she just stays still enough she won't progress for a few more weeks.  HOwever, she's also beginning to discuss her desire for photos and thinks she may want choose cremation.  At birth, the patient requests a blessing of her baby.  Will continue to follow.  Please page as further needs arise.  Maryanna Shape. Carley Hammed, M.Div. [facility] Chaplain Pager [phone] Office [phone]

## 2016-10-14 NOTE — Progress Notes (Signed)
Patient is sitting upright and taking a sponge bath. She noticed some bright red bleeding this am with wiping and states she woke up this morning with a lot of cramping.  On exam,  Patient is noted to have a bulging bag in the vagina  I cannot determine how much the cervix is dilated because of the bulging bag   I spoke to the patient and her husband at length about the extremely poor prognosis  I think she is going to deliver soon.  Discussed with her that the baby is previable  She is naturally very upset and stated "can't you do anything are you just going to throw the baby in the trash can"  I discussed with them that an emergency cerclage is not an option and that with cervical incompetence sometimes there is very little we can do to prevent a preterm/previable delivery  At this time, I am going to transfer her to labor and delivery for closer monitoring.

## 2016-10-14 NOTE — Progress Notes (Signed)
Called to the room patient is having vaginal bleeding.

## 2016-10-14 NOTE — Progress Notes (Signed)
Monique Wood was lying in bed, alert and awake when I arrived. Her husband and daughter [name]) were bedside. Hayley was appropriately tearful throughout our visit. She talked about how much this hurts. We talked about the blessing they want for Danton Clap when she is born and discussed what would be meaningful them. They are Christians and would like a baptizing in addition to the blessing. I described the baptizing as a sprinkling and they liked that idea. We discussed a blessing that would include the reading of Scripture (child-related) and prayer. We talked about her grieving process, as she described how painful it is. During I visit, I offered thoughtful and empathic presence, listening support and interest in how the blessing for their baby would be meaningful for them. They wanted prayer prior to my leaving and were appreciative of visit. At the time of delivery they would like the blessing for the baby. Please page [phone]. Chaplain service is available 24 hours. Chaplain Marjory Lies, MDiv.   10/14/16 1900  Clinical Encounter Type  Visited With Patient and family together  Visit Type Spiritual support  Referral From Chaplain

## 2016-10-14 NOTE — Progress Notes (Signed)
Pharmacy Antibiotic Note  Monique Wood is a 41 y.o. female admitted on 10/13/2016 with r/o chorio. Pharmacy has been consulted for Unasyn dosing.  Plan: Unasyn 3g IV q6h  Height: 5\' 5"  (165.1 cm) Weight: 185 lb (83.9 kg) IBW/kg (Calculated) : 57  Temp (24hrs), Avg:98.2 F (36.8 C), Min:97.3 F (36.3 C), Max:98.7 F (37.1 C)   Recent Labs Lab 10/13/16 0756  WBC 9.1    CrCl cannot be calculated (Patient's most recent lab result is older than the maximum 21 days allowed.).    No Known Allergies   Thank you for allowing pharmacy to be a part of this patient's care.  Lenore Manner [place] 10/14/2016 12:17 PM

## 2016-10-15 ENCOUNTER — Encounter (HOSPITAL_COMMUNITY): Payer: Self-pay | Admitting: Anesthesiology

## 2016-10-15 ENCOUNTER — Inpatient Hospital Stay (HOSPITAL_COMMUNITY): Payer: BLUE CROSS/BLUE SHIELD | Admitting: Anesthesiology

## 2016-10-15 LAB — CBC
HEMATOCRIT: 36.2 % (ref 36.0–46.0)
HEMOGLOBIN: 12.5 g/dL (ref 12.0–15.0)
MCH: 29.2 pg (ref 26.0–34.0)
MCHC: 34.5 g/dL (ref 30.0–36.0)
MCV: 84.6 fL (ref 78.0–100.0)
Platelets: 259 10*3/uL (ref 150–400)
RBC: 4.28 MIL/uL (ref 3.87–5.11)
RDW: 14.6 % (ref 11.5–15.5)
WBC: 12.8 10*3/uL — ABNORMAL HIGH (ref 4.0–10.5)

## 2016-10-15 MED ORDER — PHENYLEPHRINE 40 MCG/ML (10ML) SYRINGE FOR IV PUSH (FOR BLOOD PRESSURE SUPPORT)
80.0000 ug | PREFILLED_SYRINGE | INTRAVENOUS | Status: DC | PRN
  Filled 2016-10-15: qty 10
  Filled 2016-10-15: qty 5

## 2016-10-15 MED ORDER — OXYCODONE HCL 5 MG PO TABS: 5.0000 mg | ORAL_TABLET | ORAL | Status: DC | PRN

## 2016-10-15 MED ORDER — LACTATED RINGERS IV SOLN
500.0000 mL | Freq: Once | INTRAVENOUS | Status: AC
  Administered 2016-10-15: 500 mL via INTRAVENOUS

## 2016-10-15 MED ORDER — TETANUS-DIPHTH-ACELL PERTUSSIS 5-2.5-18.5 LF-MCG/0.5 IM SUSP: 0.5000 mL | Freq: Once | INTRAMUSCULAR | Status: DC

## 2016-10-15 MED ORDER — MISOPROSTOL 200 MCG PO TABS: 400.0000 ug | ORAL_TABLET | ORAL | Status: DC

## 2016-10-15 MED ORDER — DIPHENHYDRAMINE HCL 50 MG/ML IJ SOLN: 12.5000 mg | INTRAMUSCULAR | Status: DC | PRN

## 2016-10-15 MED ORDER — MISOPROSTOL 200 MCG PO TABS
ORAL_TABLET | ORAL | Status: AC
  Filled 2016-10-15: qty 2

## 2016-10-15 MED ORDER — OXYTOCIN 40 UNITS IN LACTATED RINGERS INFUSION - SIMPLE MED
INTRAVENOUS | Status: AC
  Administered 2016-10-15: 09:00:00
  Filled 2016-10-15: qty 1000

## 2016-10-15 MED ORDER — ONDANSETRON HCL 4 MG/2ML IJ SOLN: 4.0000 mg | INTRAMUSCULAR | Status: DC | PRN

## 2016-10-15 MED ORDER — SENNOSIDES-DOCUSATE SODIUM 8.6-50 MG PO TABS
2.0000 | ORAL_TABLET | ORAL | Status: DC
  Filled 2016-10-15: qty 2

## 2016-10-15 MED ORDER — ACETAMINOPHEN 325 MG PO TABS: 650.0000 mg | ORAL_TABLET | ORAL | Status: DC | PRN

## 2016-10-15 MED ORDER — BENZOCAINE-MENTHOL 20-0.5 % EX AERO: 1.0000 "application " | INHALATION_SPRAY | CUTANEOUS | Status: DC | PRN

## 2016-10-15 MED ORDER — MISOPROSTOL 200 MCG PO TABS
400.0000 ug | ORAL_TABLET | ORAL | Status: DC | PRN
  Administered 2016-10-15: 400 ug via ORAL
  Filled 2016-10-15: qty 2

## 2016-10-15 MED ORDER — LOPERAMIDE HCL 2 MG PO CAPS
2.0000 mg | ORAL_CAPSULE | Freq: Three times a day (TID) | ORAL | Status: DC | PRN
  Administered 2016-10-15: 2 mg via ORAL
  Filled 2016-10-15 (×2): qty 1

## 2016-10-15 MED ORDER — OXYCODONE HCL 5 MG PO TABS: 10.0000 mg | ORAL_TABLET | ORAL | Status: DC | PRN

## 2016-10-15 MED ORDER — FENTANYL 2.5 MCG/ML BUPIVACAINE 1/10 % EPIDURAL INFUSION (WH - ANES)
14.0000 mL/h | INTRAMUSCULAR | Status: DC | PRN
  Administered 2016-10-15: 14 mL/h via EPIDURAL
  Filled 2016-10-15: qty 100

## 2016-10-15 MED ORDER — SIMETHICONE 80 MG PO CHEW: 80.0000 mg | CHEWABLE_TABLET | ORAL | Status: DC | PRN

## 2016-10-15 MED ORDER — DIBUCAINE 1 % RE OINT: 1.0000 "application " | TOPICAL_OINTMENT | RECTAL | Status: DC | PRN

## 2016-10-15 MED ORDER — ONDANSETRON HCL 4 MG PO TABS
4.0000 mg | ORAL_TABLET | ORAL | Status: DC | PRN
  Administered 2016-10-15: 4 mg via ORAL
  Filled 2016-10-15: qty 1

## 2016-10-15 MED ORDER — MISOPROSTOL 200 MCG PO TABS
800.0000 ug | ORAL_TABLET | Freq: Once | ORAL | Status: AC
  Administered 2016-10-15: 800 ug via ORAL

## 2016-10-15 MED ORDER — ZOLPIDEM TARTRATE 5 MG PO TABS: 5.0000 mg | ORAL_TABLET | Freq: Every evening | ORAL | Status: DC | PRN

## 2016-10-15 MED ORDER — PRENATAL MULTIVITAMIN CH: 1.0000 | ORAL_TABLET | Freq: Every day | ORAL | Status: DC

## 2016-10-15 MED ORDER — IBUPROFEN 600 MG PO TABS
600.0000 mg | ORAL_TABLET | Freq: Four times a day (QID) | ORAL | Status: DC
  Administered 2016-10-15: 600 mg via ORAL
  Filled 2016-10-15 (×2): qty 1

## 2016-10-15 MED ORDER — LIDOCAINE HCL (PF) 1 % IJ SOLN
INTRAMUSCULAR | Status: DC | PRN
  Administered 2016-10-15 (×2): 4 mL via EPIDURAL

## 2016-10-15 MED ORDER — PHENYLEPHRINE 40 MCG/ML (10ML) SYRINGE FOR IV PUSH (FOR BLOOD PRESSURE SUPPORT)
80.0000 ug | PREFILLED_SYRINGE | INTRAVENOUS | Status: DC | PRN
  Filled 2016-10-15: qty 5

## 2016-10-15 MED ORDER — DIPHENHYDRAMINE HCL 25 MG PO CAPS: 25.0000 mg | ORAL_CAPSULE | Freq: Four times a day (QID) | ORAL | Status: DC | PRN

## 2016-10-15 MED ORDER — WITCH HAZEL-GLYCERIN EX PADS: 1.0000 "application " | MEDICATED_PAD | CUTANEOUS | Status: DC | PRN

## 2016-10-15 MED ORDER — FENTANYL CITRATE (PF) 100 MCG/2ML IJ SOLN
100.0000 ug | Freq: Once | INTRAMUSCULAR | Status: AC
  Administered 2016-10-15: 100 ug via INTRAVENOUS
  Filled 2016-10-15: qty 2

## 2016-10-15 MED ORDER — EPHEDRINE 5 MG/ML INJ
10.0000 mg | INTRAVENOUS | Status: DC | PRN
  Filled 2016-10-15: qty 2

## 2016-10-15 NOTE — Anesthesia Preprocedure Evaluation (Addendum)
Anesthesia Evaluation  Patient identified by MRN, date of birth, ID band Patient awake    Reviewed: Allergy & Precautions, Patient's Chart, lab work & pertinent test results  Airway Mallampati: III  TM Distance: >3 FB Neck ROM: Full    Dental no notable dental hx. (+) Teeth Intact   Pulmonary neg pulmonary ROS,    Pulmonary exam normal breath sounds clear to auscultation       Cardiovascular negative cardio ROS Normal cardiovascular exam Rhythm:Regular Rate:Normal     Neuro/Psych negative neurological ROS  negative psych ROS   GI/Hepatic negative GI ROS, Neg liver ROS,   Endo/Other    Renal/GU negative Renal ROS  negative genitourinary   Musculoskeletal negative musculoskeletal ROS (+)   Abdominal (+) + obese,   Peds  Hematology negative hematology ROS (+) Obesity   Anesthesia Other Findings   Reproductive/Obstetrics (+) Pregnancy IUFD 21 weeks                             Anesthesia Physical Anesthesia Plan  ASA: II  Anesthesia Plan: Epidural   Post-op Pain Management:    Induction:   PONV Risk Score and Plan:   Airway Management Planned: Natural Airway  Additional Equipment:   Intra-op Plan:   Post-operative Plan:   Informed Consent: I have reviewed the patients History and Physical, chart, labs and discussed the procedure including the risks, benefits and alternatives for the proposed anesthesia with the patient or authorized representative who has indicated his/her understanding and acceptance.   Dental advisory given  Plan Discussed with: Anesthesiologist  Anesthesia Plan Comments:         Anesthesia Quick Evaluation

## 2016-10-15 NOTE — Progress Notes (Signed)
Follow up visit with Geoffery Spruce and Thayer Ohm after [facility] RN notified me that the family had a repeat ultrasound and learned that baby [name] no longer had heart tones. They were appropriately tearful and emotional.  Lina shared that she felt relieved that [name] went on her own terms.  She said she'd spent the night restless and talking to her and singing to her.  Camaryn's pain increased during our visit and while she was getting her epidural, she felt the baby begin to come out.  I prayed with [name] and Thayer Ohm as they held their daughter for the first time.  Thayer Ohm played "Take Me To [employer] [place]" on her phone as they began to say goodbye.    We also discussed her daughter's reaction to witnessing everyone's grief and I normalized what they felt was an abnormal response of silliness.  We discussed community resources for support in the days ahead.  When [place] left to get changed, Betsie and I processed the stress her family had been through in the days prior to her delivery (which had been concerning her when she learned that stress could have caused her cervix to dilate.)  She was able to reframe her fears to acknowledge that [name] had brought her family closer together and that her life had already had a significant meaning despite its brevity.    Thayer Ohm and [name] would like to have a brief memorial service this evening when their family arrives at the hospital.    Mariaeduarda is resting at the moment and requested that I return in 90 minutes to check in on her, which I will do.  Please page as further needs arise.  Maryanna Shape. Carley Hammed, M.Div. [facility] Chaplain Pager [phone] Office [phone]      10/15/16 1031  Clinical Encounter Type  Visited With Patient and family together  Visit Type Psychological support;Spiritual support;Death  Referral From Nurse  Spiritual Encounters  Spiritual Needs Prayer;Emotional;Grief support  Stress Factors  Patient Stress Factors Loss  Family Stress  Factors Exhausted;Loss

## 2016-10-15 NOTE — Anesthesia Procedure Notes (Signed)
Epidural Patient location during procedure: OB Start time: 10/15/2016 8:54 AM  Staffing Anesthesiologist: Mal Amabile Performed: anesthesiologist   Preanesthetic Checklist Completed: patient identified, site marked, surgical consent, pre-op evaluation, timeout performed, IV checked, risks and benefits discussed and monitors and equipment checked  Epidural Patient position: sitting Prep: site prepped and draped and DuraPrep Patient monitoring: continuous pulse ox and blood pressure Approach: midline Location: L4-L5 Injection technique: LOR air  Needle:  Needle type: Tuohy  Needle gauge: 17 G Needle length: 9 cm and 9 Needle insertion depth: 6 cm Catheter type: closed end flexible Catheter size: 19 Gauge Catheter at skin depth: 11 cm Test dose: negative and Other  Assessment Events: blood not aspirated, injection not painful, no injection resistance, negative IV test and no paresthesia  Additional Notes Patient identified. Risks and benefits discussed including failed block, incomplete  Pain control, post dural puncture headache, nerve damage, paralysis, blood pressure Changes, nausea, vomiting, reactions to medications-both toxic and allergic and post Partum back pain. All questions were answered. Patient expressed understanding and wished to proceed. Sterile technique was used throughout procedure. Epidural site was Dressed with sterile barrier dressing. No paresthesias, signs of intravascular injection Or signs of intrathecal spread were encountered.  Patient was more comfortable after the epidural was dosed. Please see RN's note for documentation of vital signs  which are stable.

## 2016-10-15 NOTE — Lactation Note (Signed)
Lactation Consultation Note  Patient Name: Monique Wood Today's Date: 10/15/2016 Lactation was called to talk with mom about drying up her milk. Reviewed comfort measures and left handout if mom has any questions.    Rulon Eisenmenger 10/15/2016, 11:25 AM

## 2016-10-15 NOTE — Progress Notes (Signed)
Patient has not felt any FM overnight. I recommended we perform a bedside ultrasound to assess fetal heart tones. Bedside ultrasound performed and no fetal heart tones noted, confirmed by two examiners. This was demonstrated to the patient and her husband who were appropriately tearful. Bulging bag with fetal feet felt at vaginal introitus. However, US shows fetal head/shoulders/body still in cervix ~ 5cm dilated. Discussed Cytotec IOL. Given ho prior CS, risk uterine rupture 1.5%. Understands this risk and the possibility of requiring emergent hysterectomy. She also understands the risk of retained placenta and the possibility of D&E. At this time, pt request chaplain and declines autopsy. She would like to hold her baby Monique Wood).    Rosie Fate MD

## 2016-10-16 LAB — CBC
HEMATOCRIT: 31.8 % — AB (ref 36.0–46.0)
Hemoglobin: 10.9 g/dL — ABNORMAL LOW (ref 12.0–15.0)
MCH: 29 pg (ref 26.0–34.0)
MCHC: 34.3 g/dL (ref 30.0–36.0)
MCV: 84.6 fL (ref 78.0–100.0)
Platelets: 247 10*3/uL (ref 150–400)
RBC: 3.76 MIL/uL — ABNORMAL LOW (ref 3.87–5.11)
RDW: 14.7 % (ref 11.5–15.5)
WBC: 10 10*3/uL (ref 4.0–10.5)

## 2016-10-16 NOTE — Progress Notes (Signed)
I spent time with Nida Boatman and Catricia's father and his SO.  They asked for prayer before they left to go home.  After the prayer, they asked that I stay in the room with their baby, Monique Wood while they left to go home until their baby would be picked up by the [facility].  Chaplain Dyanne Carrel, Bcc Pager, [phone] 3:46 PM    10/16/16 1500  Clinical Encounter Type  Visited With Patient and family together  Visit Type Spiritual support  Referral From Chaplain;Nurse  Spiritual Encounters  Spiritual Needs Prayer;Emotional;Grief support

## 2016-10-16 NOTE — Progress Notes (Signed)
Discharge teaching complete with pt. Pt understood all information and did not have any questions. Pt discharged home to family. 

## 2016-10-16 NOTE — Anesthesia Postprocedure Evaluation (Signed)
Anesthesia Post Note  Patient: Marcelyn Bruins  Procedure(s) Performed: * No procedures listed *     Patient location during evaluation: Mother Baby Anesthesia Type: Epidural Level of consciousness: awake and alert and oriented Pain management: satisfactory to patient Vital Signs Assessment: post-procedure vital signs reviewed and stable Respiratory status: spontaneous breathing and nonlabored ventilation Cardiovascular status: stable Postop Assessment: no headache, no backache, no signs of nausea or vomiting, adequate PO intake and patient able to bend at knees (patient up walking) Anesthetic complications: no    Last Vitals:  Vitals:   10/16/16 0322 10/16/16 1000  BP: (!) 88/40 (!) 95/51  Pulse: 61 74  Resp: 16 18  Temp: 37 C 36.8 C  SpO2: 99% 99%    Last Pain:  Vitals:   10/16/16 1030  TempSrc:   PainSc: 0-No pain   Pain Goal: Patients Stated Pain Goal: 3 (10/16/16 0930)               Madison Hickman

## 2016-10-16 NOTE — Discharge Summary (Signed)
Obstetric Discharge Summary Reason for Admission: Incompetent cervix, then IUFD and induction of labor Prenatal Procedures: ultrasound Intrapartum Procedures: spontaneous vaginal delivery Postpartum Procedures: none Complications-Operative and Postpartum: none Hemoglobin  Date Value Ref Range Status  10/16/2016 10.9 (L) 12.0 - 15.0 g/dL Final   HCT  Date Value Ref Range Status  10/16/2016 31.8 (L) 36.0 - 46.0 % Final    Physical Exam:  General: alert, cooperative, appears stated age and no distress Lochia: appropriate Uterine Fundus: firm Incision: healing well DVT Evaluation: No evidence of DVT seen on physical exam.  Discharge Diagnoses: Incompetent cervix then IUFD,   Discharge Information: Date: 10/16/2016 Activity: pelvic rest Diet: routine Medications: None Condition: stable Instructions: refer to practice specific booklet and FU in office 3 weeks, call for increase pain, fever Discharge to: home   Newborn Data: born female (Not live born, IUFD) Birth Weight: 15 oz (425 g) APGAR: 0, 0 .  Kim Lauver C 10/16/2016, 9:55 AM

## 2016-10-18 ENCOUNTER — Telehealth: Payer: Self-pay

## 2016-10-18 NOTE — Telephone Encounter (Signed)
Mom calls with concerns about donating milk as she experienced a fetal demise a few days ago.  Mom was given information about donation and drying up her milk during engorgement phase as needed.  Mom continues to be uncertain of her plan, but has resources for [facility] and donating milk.  All questions answered and mom aware to call as needed.

## 2016-10-20 LAB — ANAEROBIC CULTURE

## 2017-01-09 ENCOUNTER — Other Ambulatory Visit: Payer: Self-pay

## 2017-01-09 ENCOUNTER — Encounter: Payer: Self-pay | Admitting: Certified Nurse Midwife

## 2017-01-09 ENCOUNTER — Ambulatory Visit (INDEPENDENT_AMBULATORY_CARE_PROVIDER_SITE_OTHER): Payer: BLUE CROSS/BLUE SHIELD | Admitting: Certified Nurse Midwife

## 2017-01-09 VITALS — BP 110/72 | HR 68 | Resp 16 | Ht 65.0 in | Wt 180.0 lb

## 2017-01-09 DIAGNOSIS — Z3169 Encounter for other general counseling and advice on procreation: Secondary | ICD-10-CM | POA: Diagnosis not present

## 2017-01-09 NOTE — Patient Instructions (Signed)

## 2017-01-09 NOTE — Progress Notes (Signed)
41 y.o. Single Black female  G2P1001 here for pre-conceptual counseling. Patient had SAB with  Incompetent cervix on 10/13/16. Patient was being seen by MD at [facility]. Had SAB on 10/15/16 due to incompetent cervix and ? Infection and bulging membranes  per spouse. Patient brought her records and just wants to discuss future care Aware we do not provide Obstetrical care.  Patient and spouse here worried about incompetent cervix occurrence again. Patient and spouse voiced frustration of not knowing that she would deliver when this occurred and the baby would not survive. Had visit with her OB and discussed their concerns and that she would be considered high risk with next pregnancy. Spouse would like her seen by another practice and patient was given Dr.Tavon's name by a friend. "Just sad and want to do the what is the best for our family". Having normal periods now and taking prenatal vitamins daily. Eating well and some exercise. No health issues.  O: Healthy female WDWN  A: Healthy female AMA at 41 g 2 p1101 with history of recent delivery at 21 week due to spontaneous labor with incompetent cervix . History of healthy no complications [facility] Audley HoseMcNamera. Planning another pregnancy in 2019 and would like to establish relationship with provider prior who will be able to provide high risk care if needed.  P: Discussed current health appears to be good and to continue on prenatal vitamins. Discussed consult with MFM to determine her risk and options for care with pregnancy and recommendations. Also discussed scheduling with Dr. Rosemary Holmsavon if she feels confident in the recommendation and having a consult also regarding the same issues. Spouse feels MFM a better option due to her history. She is not sure. Discussed having a plan prior to pregnancy will allow them to know what to expect if this occurs again or plan for pregnancy complications if possible. Discussed this  may help with some of the anxiety currently occurring.. Patient and spouse will discuss and call if needs help with referral to MFM. Both thankful opportunity to discuss concerns. Will update with decision. Aware she will need to start care once she has positive pregnancy test. Offered my condolences for their loss.   Rv prn   40 minutes in face to face discussion regarding pregnancy loss and plan for another pregnancy.

## 2017-03-11 ENCOUNTER — Other Ambulatory Visit: Payer: Self-pay | Admitting: Obstetrics and Gynecology

## 2017-03-11 ENCOUNTER — Encounter (HOSPITAL_COMMUNITY): Payer: Self-pay

## 2017-03-11 ENCOUNTER — Other Ambulatory Visit: Payer: Self-pay

## 2017-03-11 ENCOUNTER — Inpatient Hospital Stay (HOSPITAL_COMMUNITY)
Admission: AD | Admit: 2017-03-11 | Discharge: 2017-03-11 | Disposition: A | Discharge status: Home / Self Care | Payer: BLUE CROSS/BLUE SHIELD | Source: Ambulatory Visit | Attending: Obstetrics | Admitting: Obstetrics

## 2017-03-11 DIAGNOSIS — O00102 Left tubal pregnancy without intrauterine pregnancy: Secondary | ICD-10-CM | POA: Insufficient documentation

## 2017-03-11 LAB — CBC WITH DIFFERENTIAL/PLATELET
BASOS PCT: 0 %
Basophils Absolute: 0 10*3/uL (ref 0.0–0.1)
EOS ABS: 0.2 10*3/uL (ref 0.0–0.7)
EOS PCT: 2 %
HCT: 37.3 % (ref 36.0–46.0)
Hemoglobin: 12.6 g/dL (ref 12.0–15.0)
LYMPHS ABS: 2.6 10*3/uL (ref 0.7–4.0)
Lymphocytes Relative: 34 %
MCH: 28.6 pg (ref 26.0–34.0)
MCHC: 33.8 g/dL (ref 30.0–36.0)
MCV: 84.8 fL (ref 78.0–100.0)
MONO ABS: 0.2 10*3/uL (ref 0.1–1.0)
MONOS PCT: 3 %
NEUTROS PCT: 61 %
Neutro Abs: 4.7 10*3/uL (ref 1.7–7.7)
Platelets: 282 10*3/uL (ref 150–400)
RBC: 4.4 MIL/uL (ref 3.87–5.11)
RDW: 15.7 % — AB (ref 11.5–15.5)
WBC: 7.8 10*3/uL (ref 4.0–10.5)

## 2017-03-11 LAB — BUN: BUN: 13 mg/dL (ref 6–20)

## 2017-03-11 LAB — AST: AST: 16 U/L (ref 15–41)

## 2017-03-11 LAB — CREATININE, SERUM
CREATININE: 0.87 mg/dL (ref 0.44–1.00)
GFR calc Af Amer: >60 mL/min (ref >60)

## 2017-03-11 MED ORDER — METHOTREXATE INJECTION FOR WOMEN'S HOSPITAL
50.0000 mg/m2 | Freq: Once | INTRAMUSCULAR | Status: AC
  Administered 2017-03-11: 95 mg via INTRAMUSCULAR
  Filled 2017-03-11: qty 1.9

## 2017-03-11 NOTE — MAU Note (Signed)
Pt from OB office for labs & mtx injection

## 2018-09-18 ENCOUNTER — Other Ambulatory Visit: Payer: Self-pay

## 2018-09-22 ENCOUNTER — Other Ambulatory Visit: Payer: Self-pay

## 2018-09-22 ENCOUNTER — Encounter: Payer: Self-pay | Admitting: Certified Nurse Midwife

## 2018-09-22 ENCOUNTER — Ambulatory Visit: Payer: BLUE CROSS/BLUE SHIELD | Admitting: Certified Nurse Midwife

## 2018-09-22 ENCOUNTER — Ambulatory Visit (INDEPENDENT_AMBULATORY_CARE_PROVIDER_SITE_OTHER): Payer: BLUE CROSS/BLUE SHIELD | Admitting: Certified Nurse Midwife

## 2018-09-22 VITALS — BP 110/70 | HR 68 | Temp 97.2°F | Resp 16 | Ht 64.75 in | Wt 170.0 lb

## 2018-09-22 DIAGNOSIS — Z124 Encounter for screening for malignant neoplasm of cervix: Secondary | ICD-10-CM

## 2018-09-22 DIAGNOSIS — N6320 Unspecified lump in the left breast, unspecified quadrant: Secondary | ICD-10-CM

## 2018-09-22 DIAGNOSIS — Z01411 Encounter for gynecological examination (general) (routine) with abnormal findings: Secondary | ICD-10-CM

## 2018-09-22 DIAGNOSIS — Z0000 Encounter for general adult medical examination without abnormal findings: Secondary | ICD-10-CM | POA: Diagnosis not present

## 2018-09-22 DIAGNOSIS — N979 Female infertility, unspecified: Secondary | ICD-10-CM | POA: Diagnosis not present

## 2018-09-22 NOTE — Progress Notes (Signed)
43 y.o. G2P1001 Single  African American Fe here for annual exam. Periods have regular and on period now. Frustrated that she has been unable to conceive again after last pregnancy. Contraception none. Previous ectopic pregnancy and incompetent cervix second trimester with last pregnancy. Very emotional still regarding that pregnancy. Patient aware she is aging and would like to conceive soon. Sees urgent care if needed. No other health issues today.  Patient's last menstrual period was 09/20/2018 (exact date).          Sexually active: Yes.    The current method of family planning is none.    Exercising: Yes.    walking Smoker:  no  Review of Systems  Constitutional: Negative.   HENT: Negative.   Eyes: Negative.   Respiratory: Negative.   Cardiovascular: Negative.   Gastrointestinal: Negative.   Genitourinary: Negative.   Musculoskeletal: Negative.   Skin: Negative.   Neurological: Negative.   Endo/Heme/Allergies: Negative.   Psychiatric/Behavioral: Negative.     Health Maintenance: Pap:  08-08-15 neg HPV HR neg, per patient 2019 pap was normal History of Abnormal Pap: no MMG:  4yrs ago Self Breast exams: no Colonoscopy:  none BMD:   none TDaP:  UTD Shingles: no Pneumonia: no Hep C and HIV: both neg per patient Labs: if needed   reports that she has quit smoking. Her smoking use included cigarettes. She has a 1.50 pack-year smoking history. She has never used smokeless tobacco. She reports that she does not drink alcohol or use drugs.  History reviewed. No pertinent past medical history.  Past Surgical History:  Procedure Laterality Date  . AUGMENTATION MAMMAPLASTY    . CESAREAN SECTION    . INTRAUTERINE DEVICE (IUD) INSERTION      Current Outpatient Medications  Medication Sig Dispense Refill  . Multiple Vitamin (MULTIVITAMIN WITH MINERALS) TABS tablet Take 1 tablet by mouth daily.     No current facility-administered medications for this visit.     Family  History  Problem Relation Age of Onset  . Diabetes Mother   . Cancer Father        Prostate Cancer  . Prostate cancer Father   . Breast cancer Paternal Aunt   . Cancer Paternal Grandmother        Brain & Bone Cancer  . Brain cancer Paternal Grandmother   . Bone cancer Paternal Grandmother     ROS:  Pertinent items are noted in HPI.  Otherwise, a comprehensive ROS was negative.  Exam:   BP 110/70   Pulse 68   Temp (!) 97.2 F (36.2 C) (Skin)   Resp 16   Ht 5' 4.75" (1.645 m)   Wt 170 lb (77.1 kg)   LMP 09/20/2018 (Exact Date)   BMI 28.51 kg/m  Height: 5' 4.75" (164.5 cm) Ht Readings from Last 3 Encounters:  09/22/18 5' 4.75" (1.645 m)  03/11/17 5\' 5"  (1.651 m)  01/09/17 5\' 5"  (1.651 m)    General appearance: alert, cooperative and appears stated age Head: Normocephalic, without obvious abnormality, atraumatic Neck: no adenopathy, supple, symmetrical, trachea midline and thyroid normal to inspection and palpation Lungs: clear to auscultation bilaterally Breasts: normal appearance, no masses or tenderness, No nipple retraction or dimpling, No nipple discharge or bleeding, No axillary or supraclavicular adenopathy, marble size breast mass at 3 o'clock at aerola edge, mobile,non tender. implant  palpated,  Heart: regular rate and rhythm Abdomen: soft, non-tender; no masses,  no organomegaly Extremities: extremities normal, atraumatic, no cyanosis or edema Skin: Skin  color, texture, turgor normal. No rashes or lesions Lymph nodes: Cervical, supraclavicular, and axillary nodes normal. No abnormal inguinal nodes palpated Neurologic: Grossly normal   Pelvic: External genitalia:  no lesions, normal female              Urethra:  normal appearing urethra with no masses, tenderness or lesions              Bartholin's and Skene's: normal                 Vagina: normal appearing vagina with normal color and discharge, no lesions              Cervix: no cervical motion tenderness,  no lesions and normal appearance with blood noted from cervix, period              Pap taken: Yes.   Bimanual Exam:  Uterus:  normal size, contour, position, consistency, mobility, non-tender and anteverted              Adnexa: normal adnexa and no mass, fullness, tenderness               Rectovaginal: Confirms               Anus:  normal sphincter tone, no lesions  Chaperone present: yes  A:  Well Woman with normal exam  Contraception none trying for pregnancy  Left breast mass ? Related to saline implant  Infertility referral desired  Lab TSH unable to draw.  P:   Reviewed health and wellness pertinent to exam  Discussed due to age would recommend referral to infertility after trying for one year and no pregnancy. Patient agreeable. Patient will be called with information on referral to Dr. Jamse ArnYalcinkya.  Discussed left breast mass and need for mammogram and US evaluation. Patient agreeable and will be called with appointment information.  Patient can come back in for or have recommended labs with infertility. Prefers there if needed.  Pap smear: yes   counseled on breast self exam, mammography screening, feminine hygiene, adequate intake of calcium and vitamin D, diet and exercise  return annually or prn  An After Visit Summary was printed and given to the patient.

## 2018-09-22 NOTE — Progress Notes (Deleted)
43 y.o. G2P1001 Single  African American Fe here for annual exam.    No LMP recorded.          Sexually active: {yes no:314532}  The current method of family planning is {contraception:315051}.    Exercising: {yes no:314532}  {types:19826} Smoker:  {YES NO:22349}  ROS  Health Maintenance: Pap:  08-08-15 neg HPV HR neg History of Abnormal Pap: {YES NO:22349} MMG:  *** Self Breast exams: {YES NO:22349} Colonoscopy:  none BMD:   none TDaP:  *** Shingles: no Pneumonia: no Hep C and HIV: HIV neg 2016 per patient Labs: ***   reports that she has never smoked. She has never used smokeless tobacco. She reports that she does not drink alcohol or use drugs.  No past medical history on file.  Past Surgical History:  Procedure Laterality Date  . AUGMENTATION MAMMAPLASTY    . CESAREAN SECTION    . INTRAUTERINE DEVICE (IUD) INSERTION      Current Outpatient Medications  Medication Sig Dispense Refill  . Multiple Vitamin (MULTIVITAMIN WITH MINERALS) TABS tablet Take 1 tablet by mouth daily.     No current facility-administered medications for this visit.     Family History  Problem Relation Age of Onset  . Diabetes Mother   . Cancer Father        Prostate Cancer  . Prostate cancer Father   . Breast cancer Paternal Aunt   . Cancer Paternal Grandmother        Brain & Bone Cancer  . Brain cancer Paternal Grandmother   . Bone cancer Paternal Grandmother     ROS:  Pertinent items are noted in HPI.  Otherwise, a comprehensive ROS was negative.  Exam:   There were no vitals taken for this visit.   Ht Readings from Last 3 Encounters:  03/11/17 5\' 5"  (1.651 m)  01/09/17 5\' 5"  (1.651 m)  10/13/16 5\' 5"  (1.651 m)    General appearance: alert, cooperative and appears stated age Head: Normocephalic, without obvious abnormality, atraumatic Neck: no adenopathy, supple, symmetrical, trachea midline and thyroid {EXAM; THYROID:18604} Lungs: clear to auscultation bilaterally Breasts:  {Exam; breast:13139::"normal appearance, no masses or tenderness"} Heart: regular rate and rhythm Abdomen: soft, non-tender; no masses,  no organomegaly Extremities: extremities normal, atraumatic, no cyanosis or edema Skin: Skin color, texture, turgor normal. No rashes or lesions Lymph nodes: Cervical, supraclavicular, and axillary nodes normal. No abnormal inguinal nodes palpated Neurologic: Grossly normal   Pelvic: External genitalia:  no lesions              Urethra:  normal appearing urethra with no masses, tenderness or lesions              Bartholin's and Skene's: normal                 Vagina: normal appearing vagina with normal color and discharge, no lesions              Cervix: {exam; cervix:14595}              Pap taken: {yes no:314532} Bimanual Exam:  Uterus:  {exam; uterus:12215}              Adnexa: {exam; adnexa:12223}               Rectovaginal: Confirms               Anus:  normal sphincter tone, no lesions  Chaperone present: ***  A:  Well Woman with normal exam  P:   Reviewed health and wellness pertinent to exam  Pap smear: {YES NO:22349}  {plan; gyn:5269::"mammogram","pap smear","return annually or prn"}  An After Visit Summary was printed and given to the patient.

## 2018-09-23 ENCOUNTER — Telehealth: Payer: Self-pay | Admitting: *Deleted

## 2018-09-23 DIAGNOSIS — N6320 Unspecified lump in the left breast, unspecified quadrant: Secondary | ICD-10-CM

## 2018-09-23 DIAGNOSIS — N979 Female infertility, unspecified: Secondary | ICD-10-CM

## 2018-09-23 NOTE — Telephone Encounter (Signed)
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 Carmela Rima and will be called with information.

## 2018-09-23 NOTE — Telephone Encounter (Signed)
Spoke with Lilia Pro at [facility] Patient scheduled for bilateral Dx MMG and left breast US, if needed, on 09/28/18 at 9am, arriving at 8:40am.

## 2018-09-23 NOTE — Telephone Encounter (Signed)
Spoke with patient, notified of breast imaging appt at [facility] and advised referral has been placed. Patient is aware she will be contacted by referral coordinator once appt is scheduled. Patient verbalizes understanding and is agreeable.   Routing to provider for final review. Patient is agreeable to disposition. Will close encounter.  Cc: Magdalene Patricia

## 2018-09-26 LAB — CYTOLOGY - PAP
Diagnosis: NEGATIVE
HPV: NOT DETECTED

## 2018-09-28 ENCOUNTER — Telehealth: Payer: Self-pay | Admitting: *Deleted

## 2018-09-28 ENCOUNTER — Other Ambulatory Visit: Payer: BLUE CROSS/BLUE SHIELD

## 2018-09-28 NOTE — Telephone Encounter (Signed)
Patient seen in office on 09/22/18, imaging recommended for left breast mass. TBC not covered under her plan, requesting order for bilateral dx MMG and left breast US to [facility]. Fax order to [phone]. Patient is there at facility now to schedule.   Advised I will fax order as requested, patient verbalizes understanding and is agreeable. Aware to call with any additional assistance.   Routing to provider for final review. Patient is agreeable to disposition. Will close encounter.

## 2018-10-05 IMAGING — US US MFM OB LIMITED
1 series · 15 of 16 positions shown · non-contrast
Comparison: none

[Series 1: us mfm ob limited · 15 of 16 slices shown]
[im 1/16]
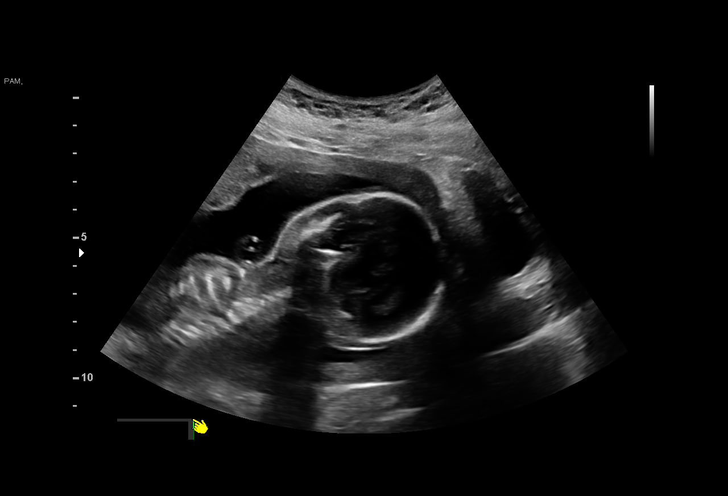
[im 2/16]
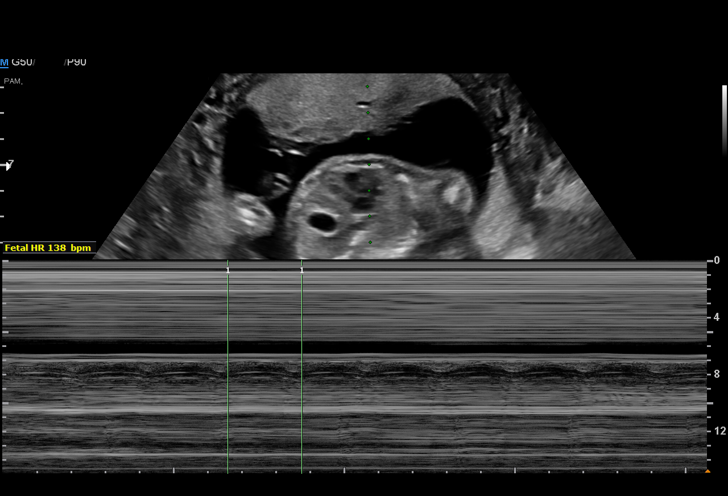
[im 3/16]
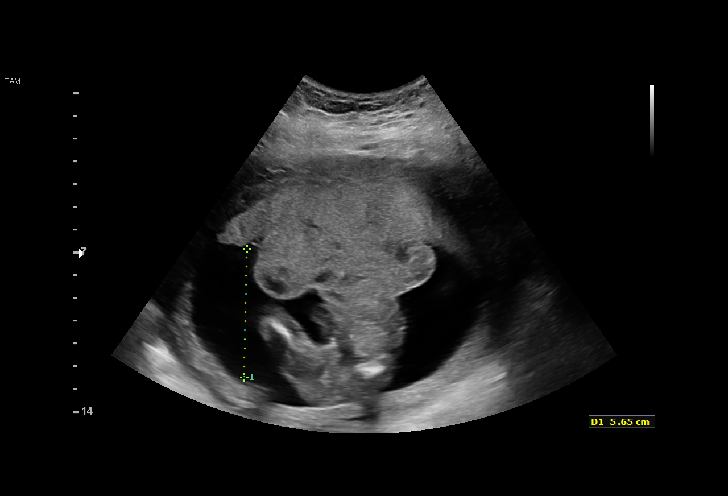
[im 4/16]
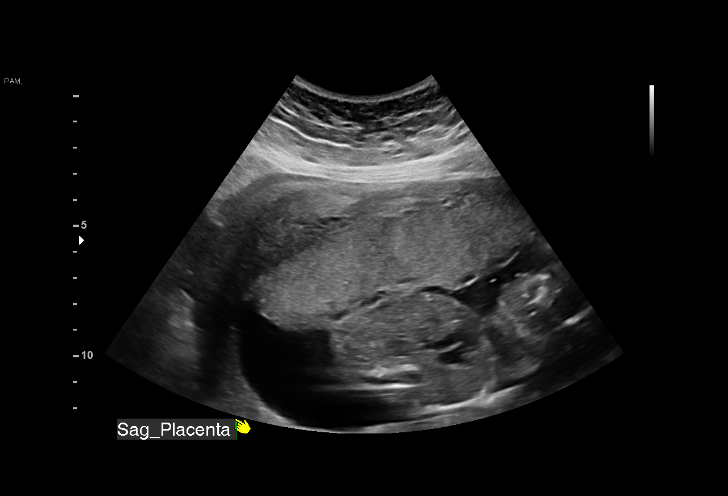
[im 5/16]
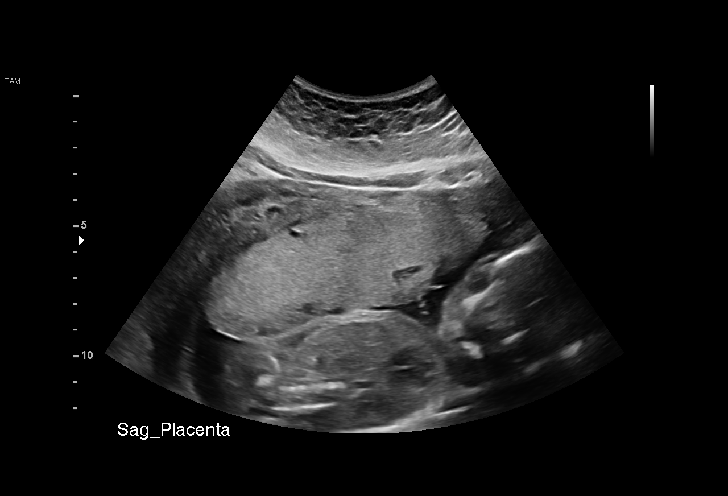
[im 6/16]
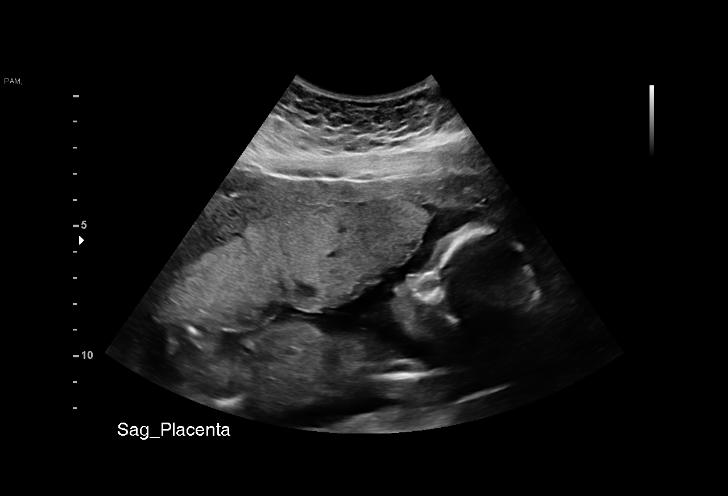
[im 7/16]
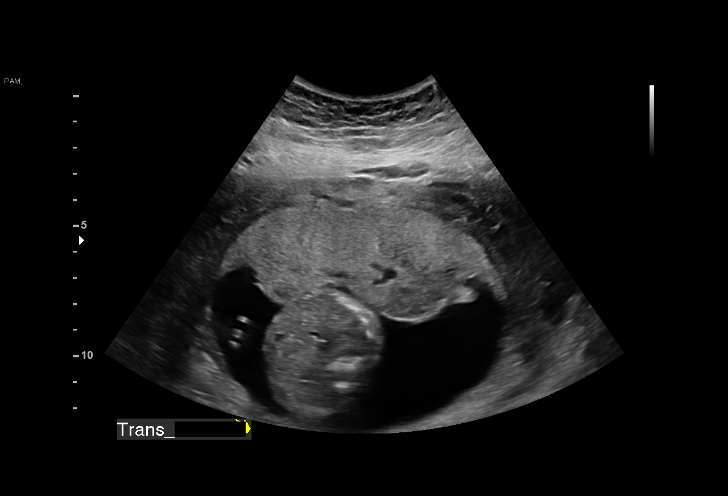
[im 9/16]
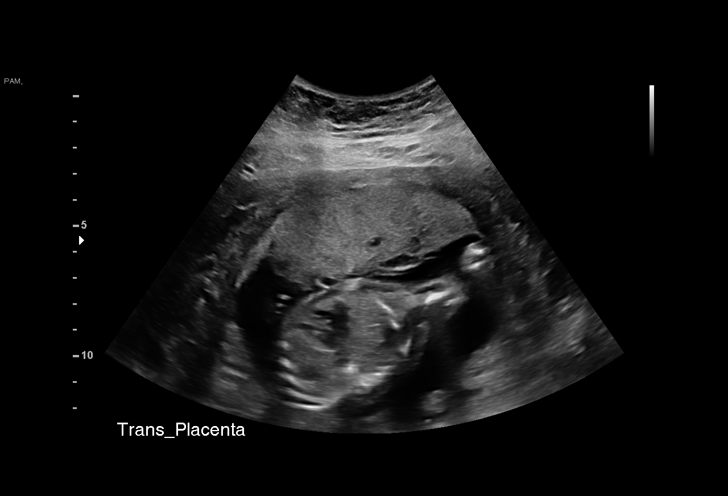
[im 10/16]
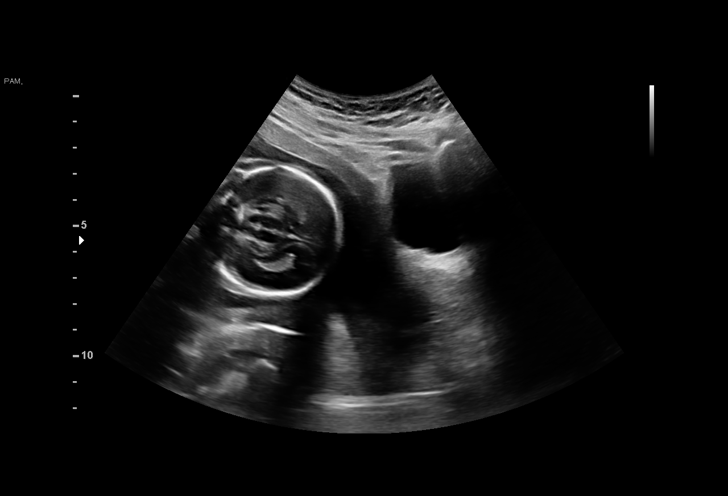
[im 11/16]
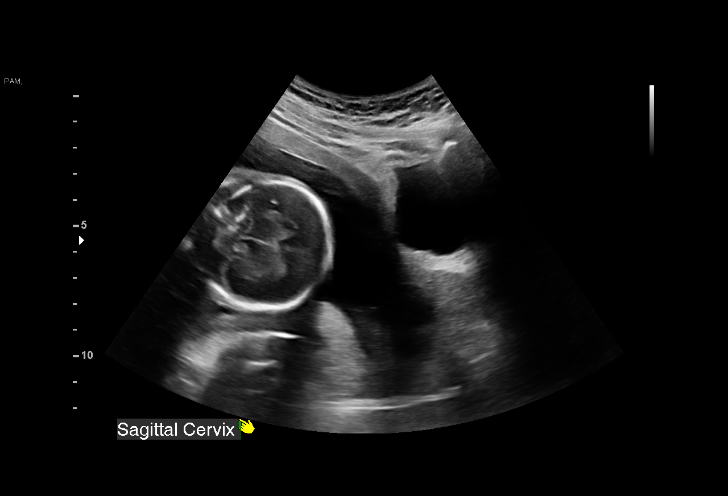
[im 12/16]
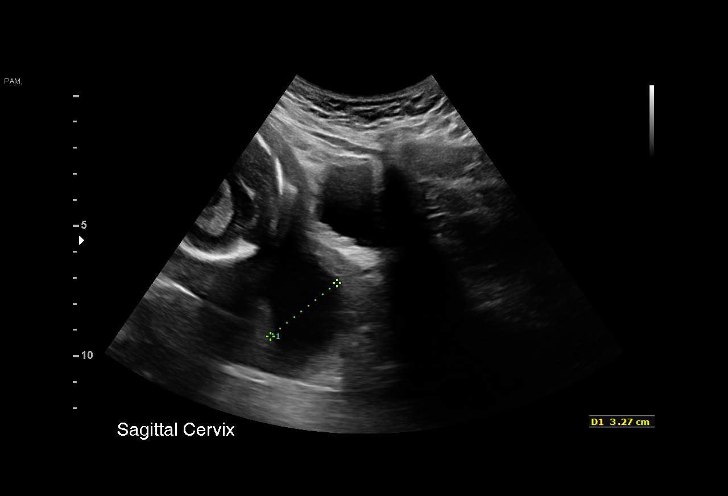
[im 13/16]
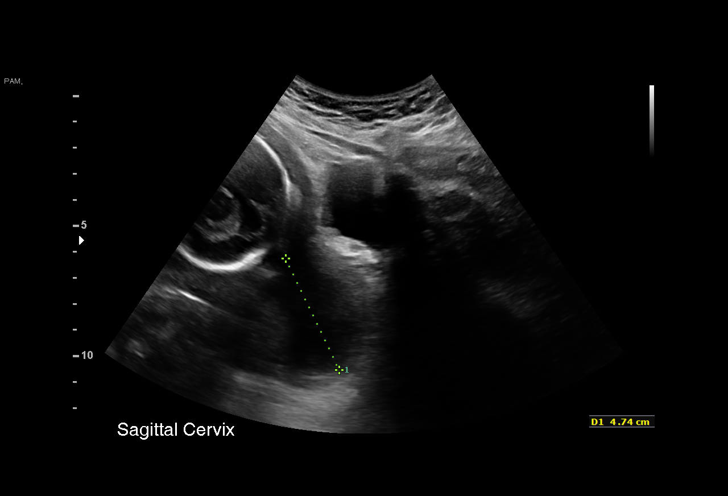
[im 14/16]
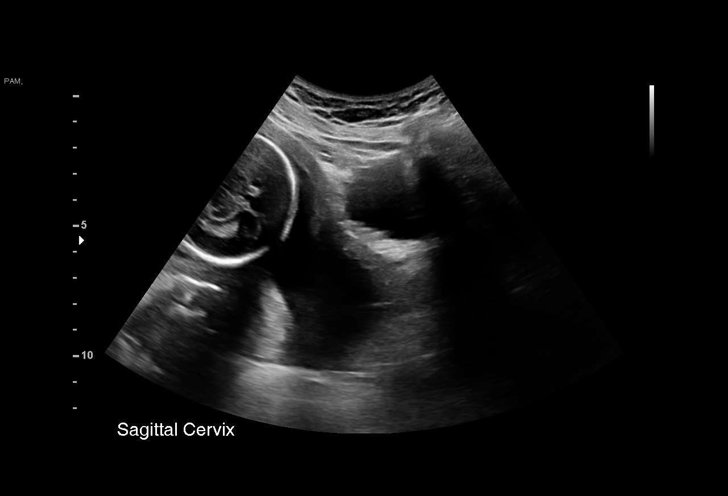
[im 15/16]
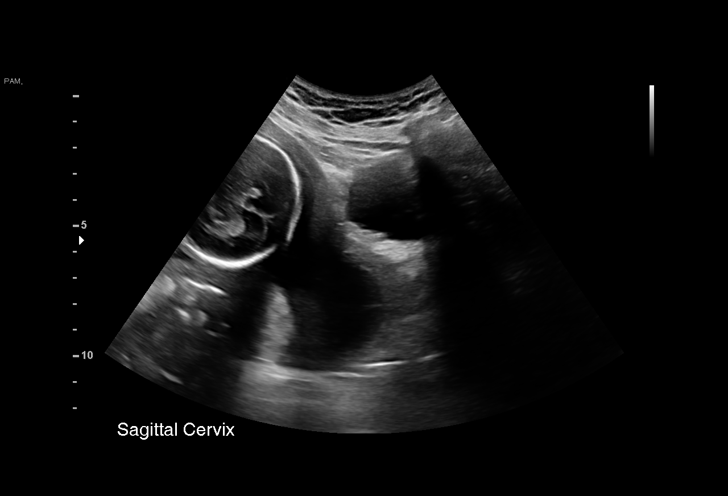
[im 16/16]
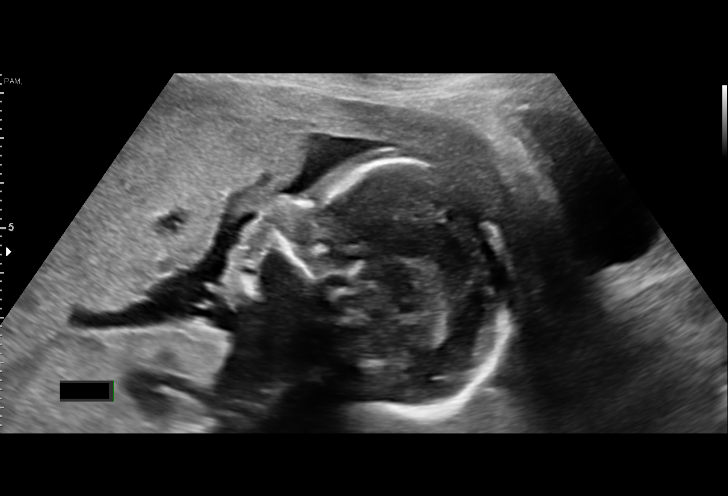

[15 of 16 positions shown; findings below may reference images not displayed]

Performed By:      Muuqtar Aptidon        Secondary Phy.:    YAMAGUCHI

1  KARISMA BOOK           [id number]      [id number]     [id number]
Indications

21 weeks gestation of pregnancy
Abdominal pain in pregnancy
Cervical incompetence, second trimester
OB History

Gravidity:     2         Term:  1
Living:        1
Fetal Evaluation

Num Of Fetuses:      1
Fetal Heart          138
Rate(bpm):
Cardiac Activity:    Observed
Presentation:        Cephalic
Placenta:            Anterior, above cervical os

Amniotic Fluid
AFI FV:      Subjectively within normal limits

Largest Pocket(cm)
5.7
Gestational Age
Clinical EDD:   21w 0d                                         EDD:  02/23/17
Best:           21w 0d    Det. By:  Clinical EDD               EDD:  02/23/17
Cervix Uterus Adnexa

Cervix
Funnel Length:      4.7   cm.
Funnel Width:       3.2  cm.
Hourglass membranes into the vagina.
Impression

Single living intrauterine pregnancy at 96w4d.
Normal amniotic fluid volume.
The cervix appears dilated with prolapsing membranes in the
vagina.
Recommendations

Follow-up as clinically indicated.

## 2018-10-05 IMAGING — US US MFM OB LIMITED
1 series · 15 of 16 positions shown · non-contrast
Comparison: none

[Series 1: us mfm ob limited · 15 of 16 slices shown]
[im 1/16]
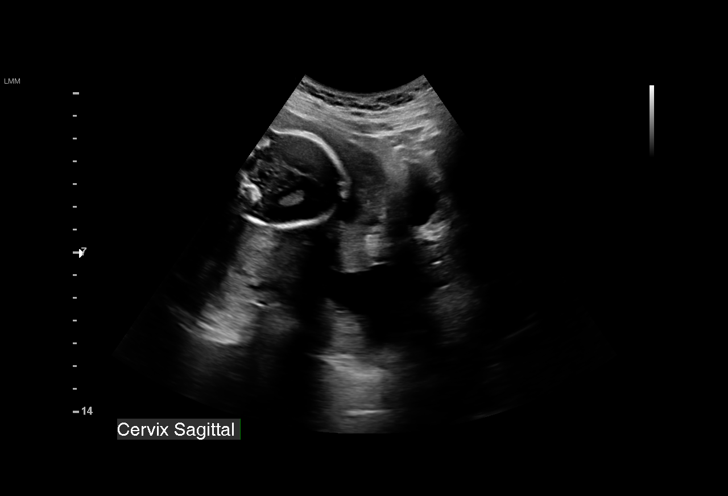
[im 2/16]
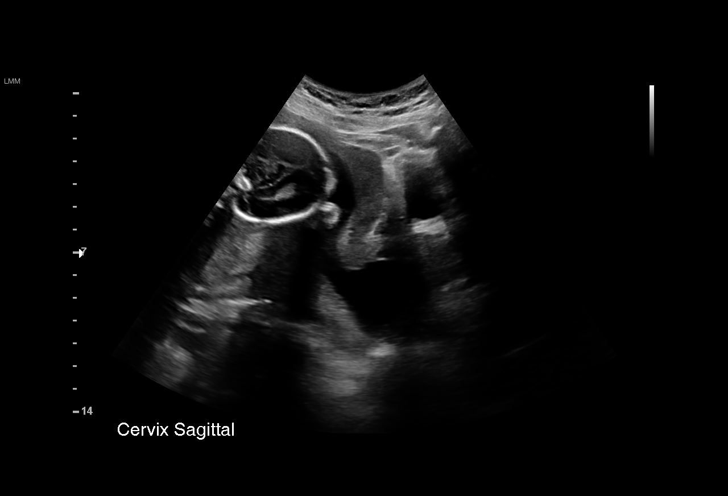
[im 3/16]
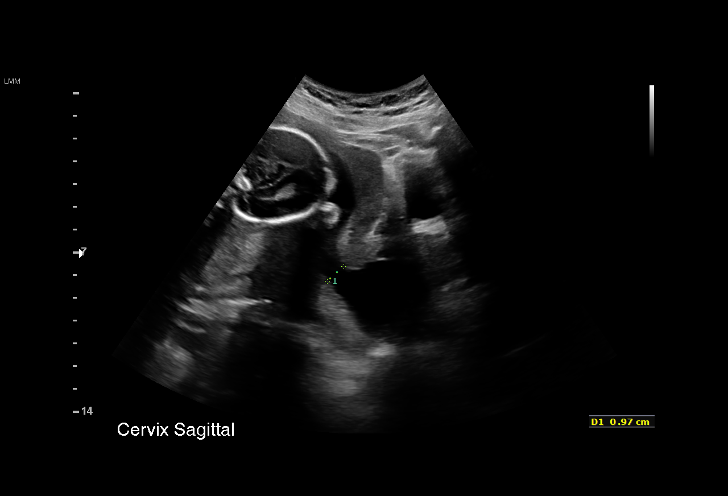
[im 4/16]
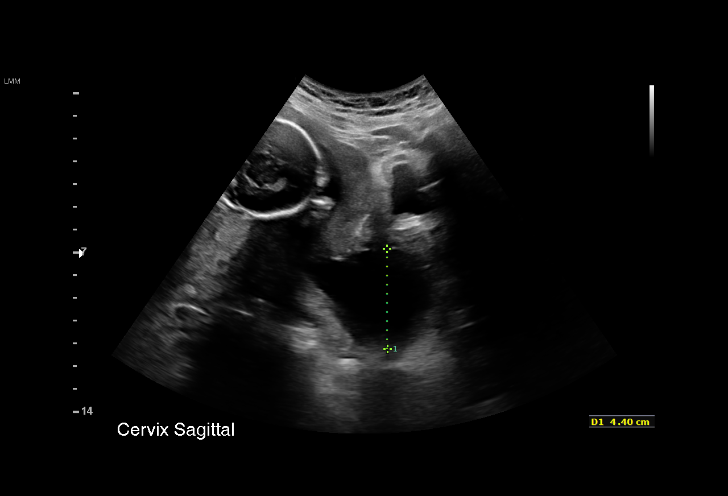
[im 5/16]
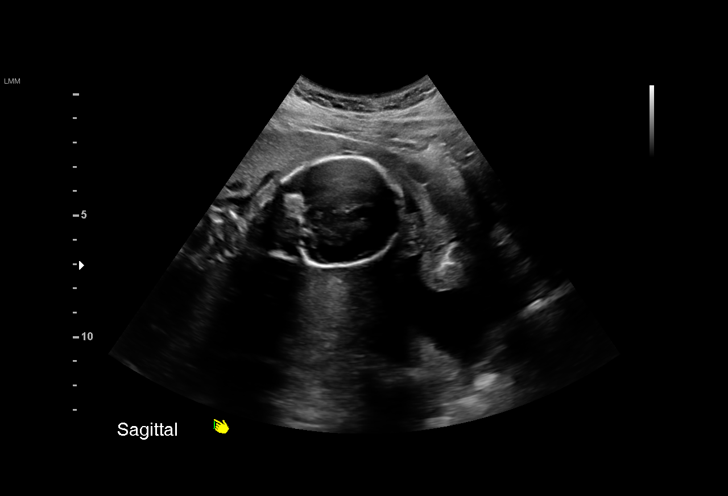
[im 6/16]
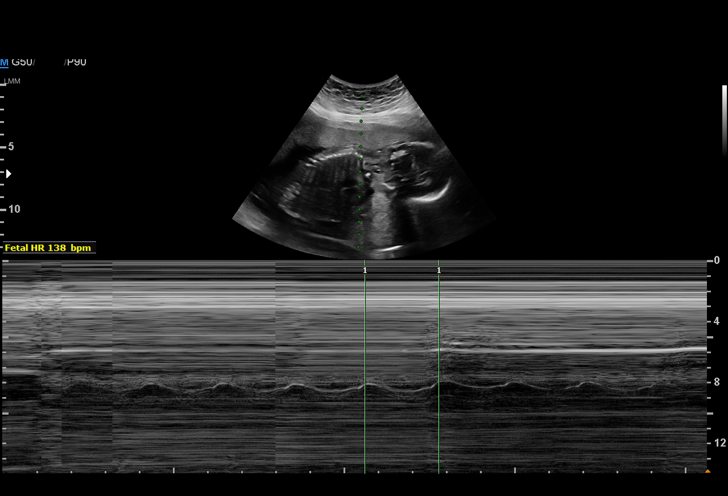
[im 7/16]
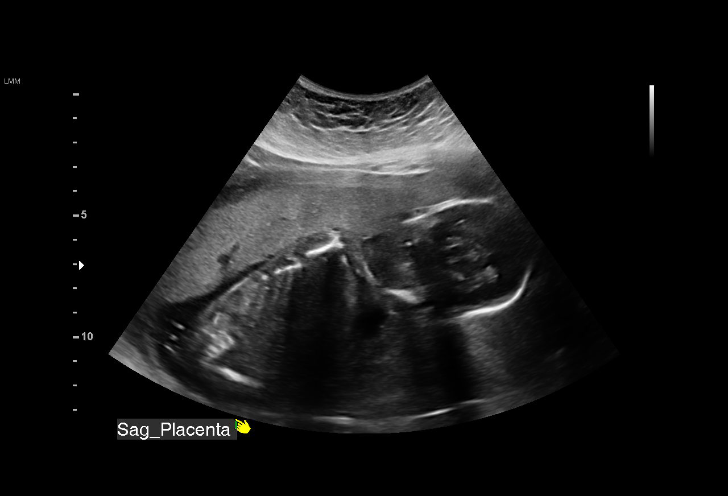
[im 9/16]
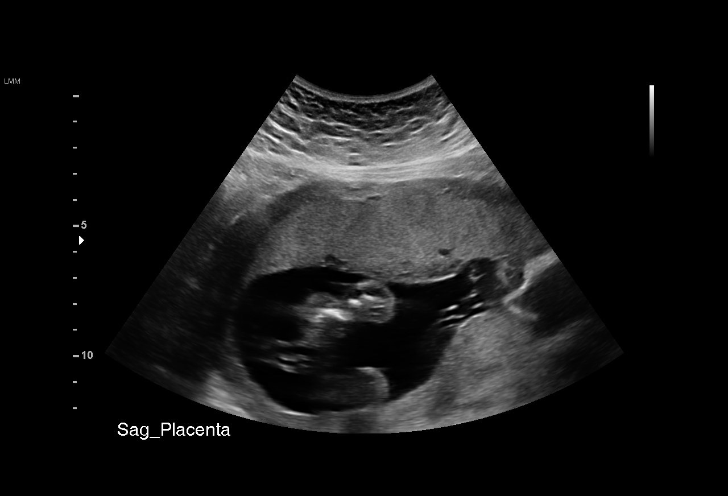
[im 10/16]
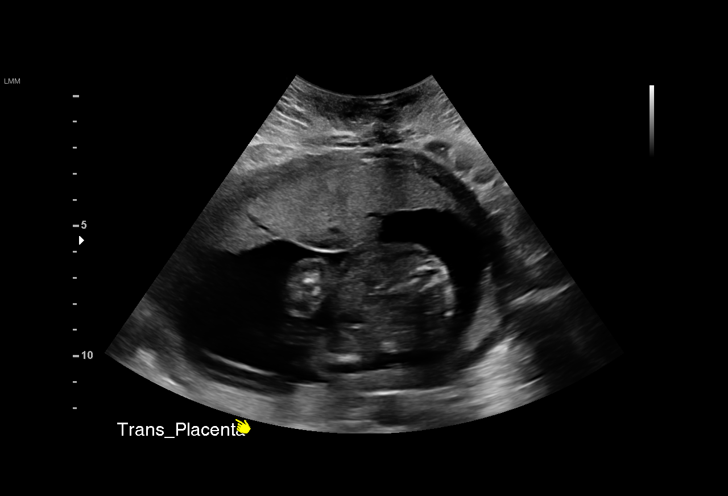
[im 11/16]
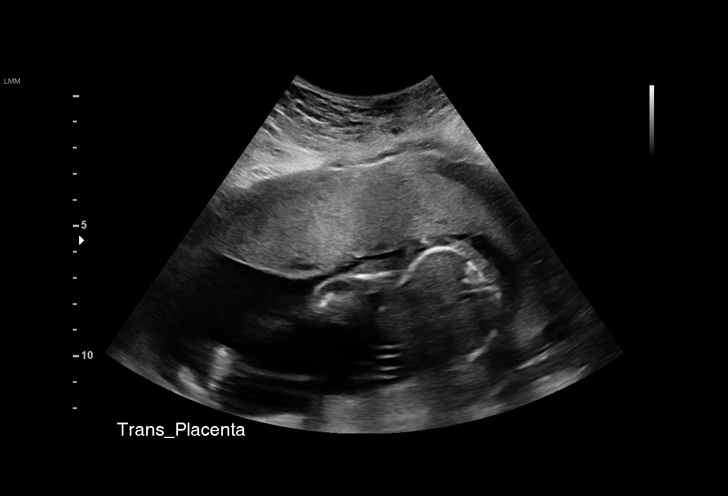
[im 12/16]
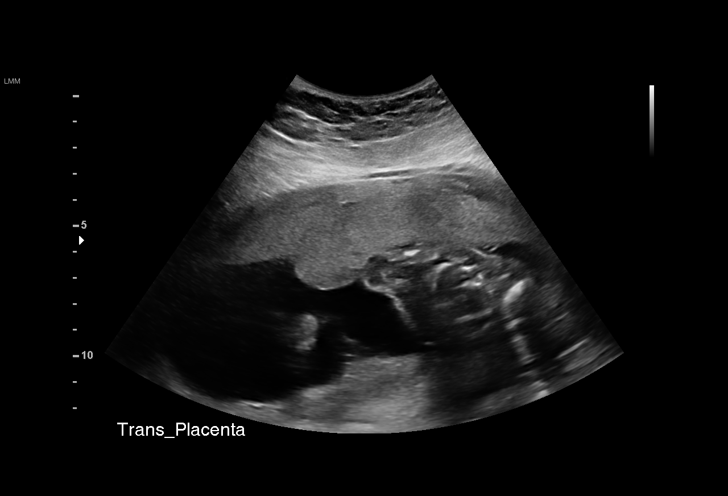
[im 13/16]
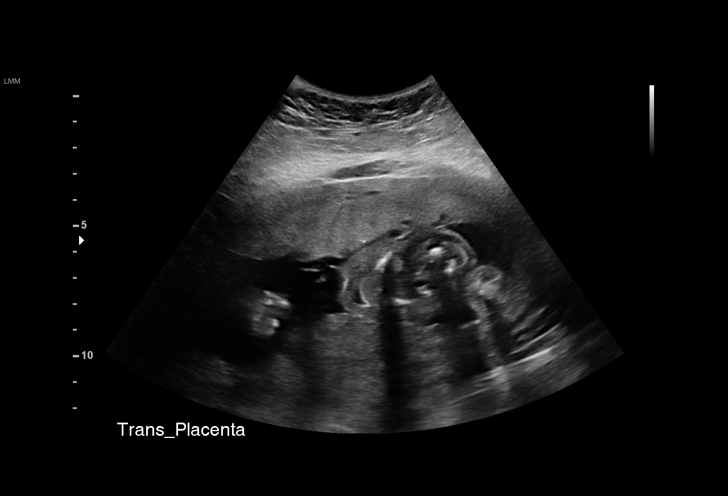
[im 14/16]
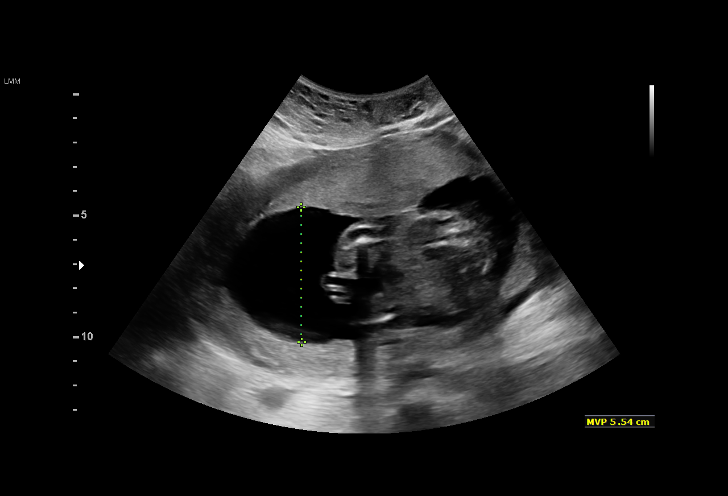
[im 15/16]
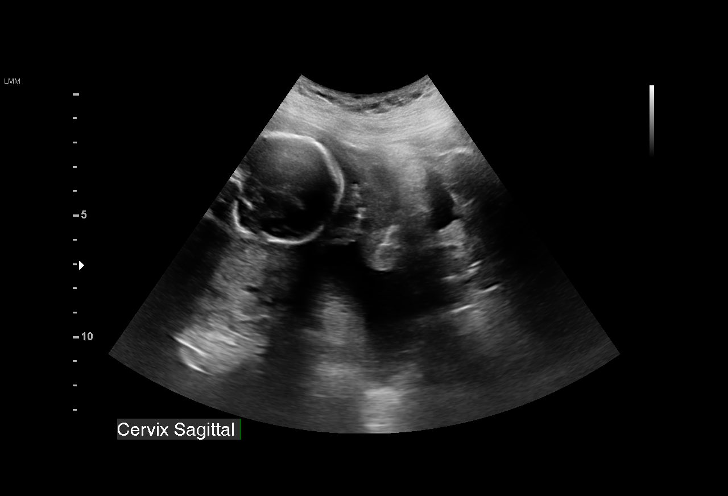
[im 16/16]
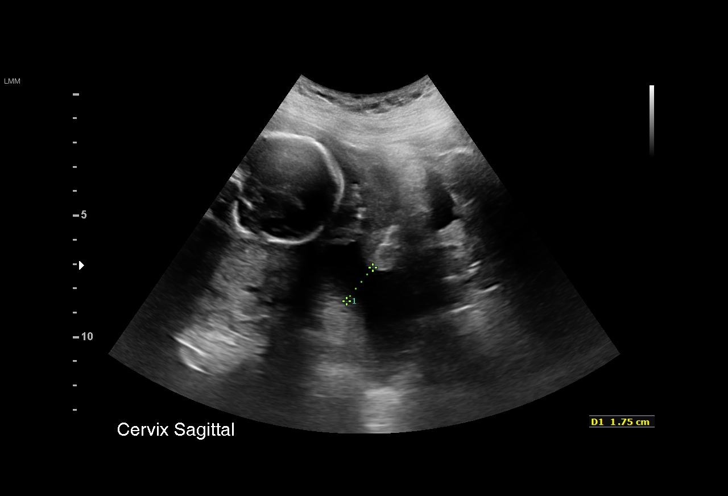

[15 of 16 positions shown; findings below may reference images not displayed]

Attending:         Tuulimari Salonranta         Secondary Phy.:    NAWAR Nursing-
MAU/Triage

1  LULIS SIEBER             [id number]      [id number]     [id number]
Indications

21 weeks gestation of pregnancy
Abdominal pain in pregnancy
Encounter for cervical length
OB History

Gravidity:     2         Term:  1
Living:        1
Fetal Evaluation

Num Of Fetuses:      1
Fetal Heart          138
Rate(bpm):
Cardiac Activity:    Observed
Presentation:        Cephalic
Placenta:            Anterior, above cervical os

Amniotic Fluid
AFI FV:      Subjectively within normal limits

Largest Pocket(cm)
5.5
Gestational Age

Clinical EDD:   21w 0d                                         EDD:  02/23/17
Best:           21w 0d    Det. By:  Clinical EDD               EDD:  02/23/17
Cervix Uterus Adnexa

Cervix
Hourglass membranes into the vagina. Cervix open to 1.8 cm.

Uterus
No abnormality visualized.

Cul De Sac:   No free fluid seen.
Impression

Single living intrauterine pregnancy at 83w5d.
Normal amniotic fluid volume.
The cervix appears dilated to 1.8cm with prolapsing membranes
in the vagina.
Recommendations

Follow-up as clinically indicated.

## 2018-10-06 IMAGING — US US MFM OB LIMITED
1 series · 15 of 20 positions shown · non-contrast
Comparison: none

[Series 1: us mfm ob limited · 20 acquisitions, 15 frames shown]
[im 1/20]
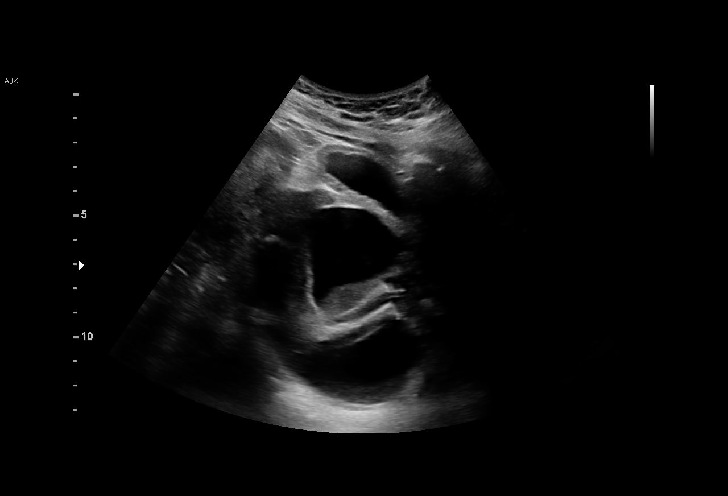
[im 3/20]
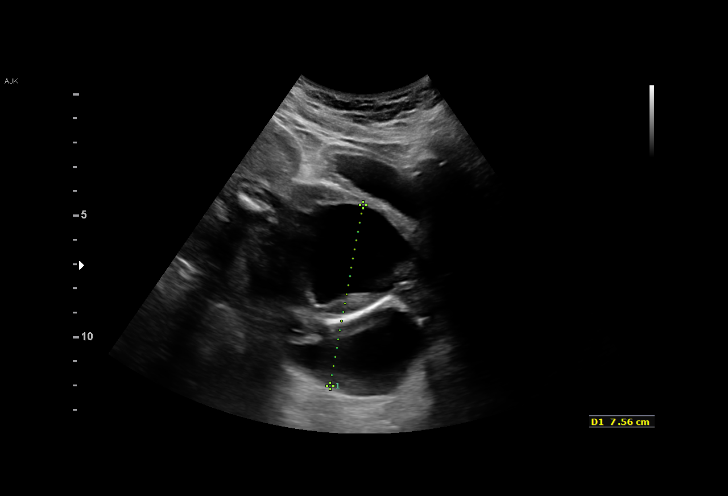
[im 4/20]
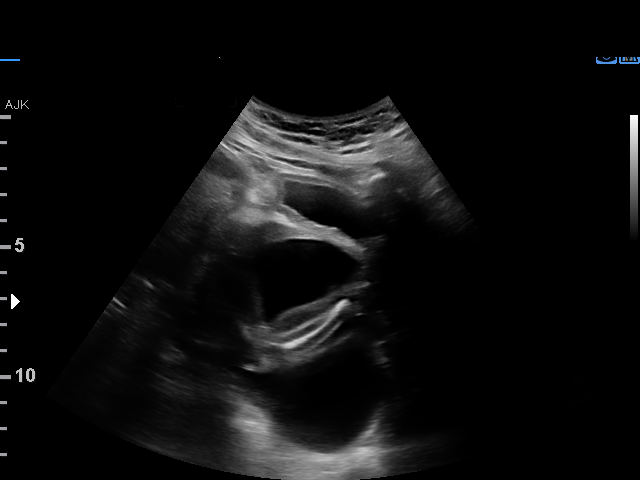
[im 5/20]
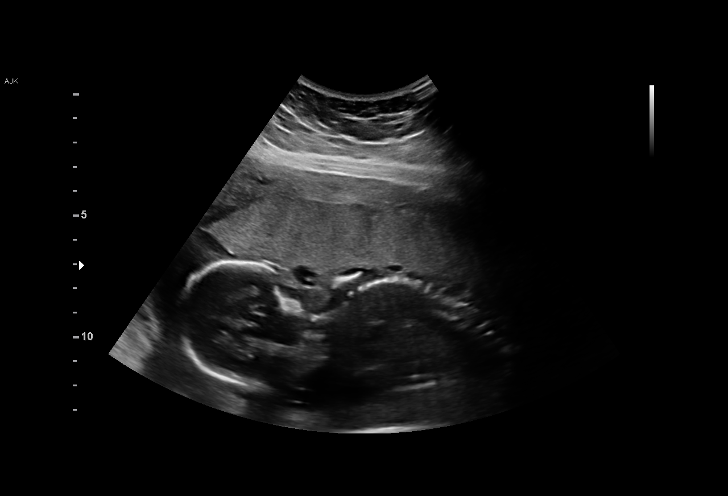
[im 7/20]
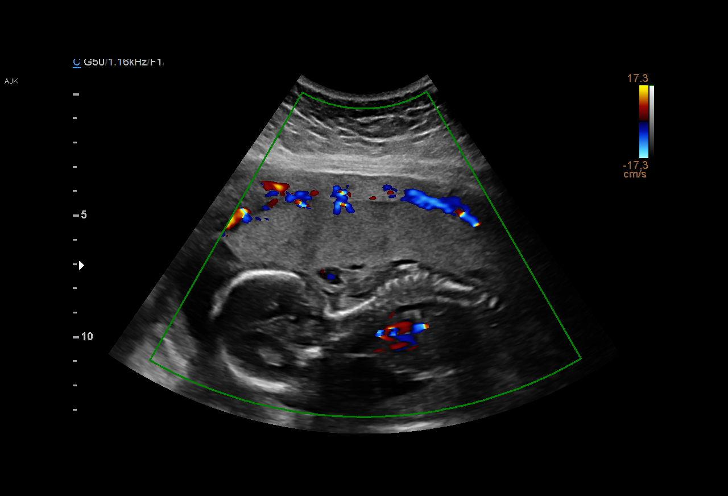
[im 8/20]
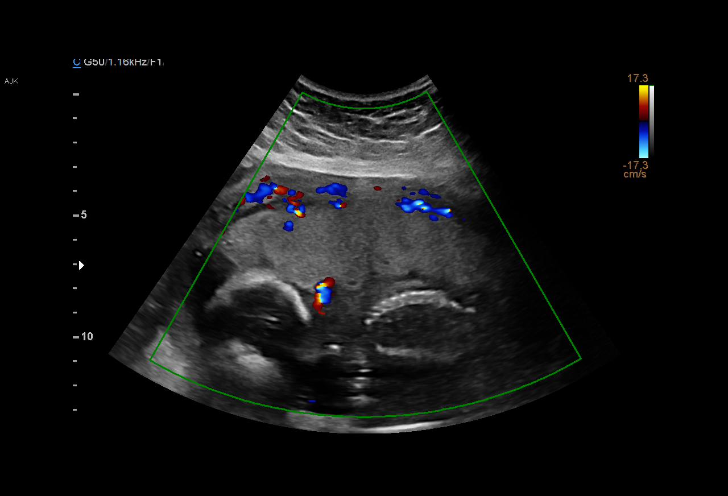
[im 9/20]
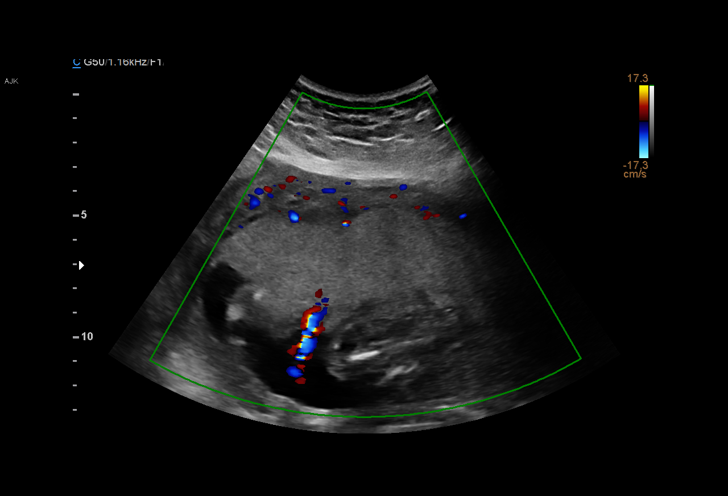
[im 11/20]
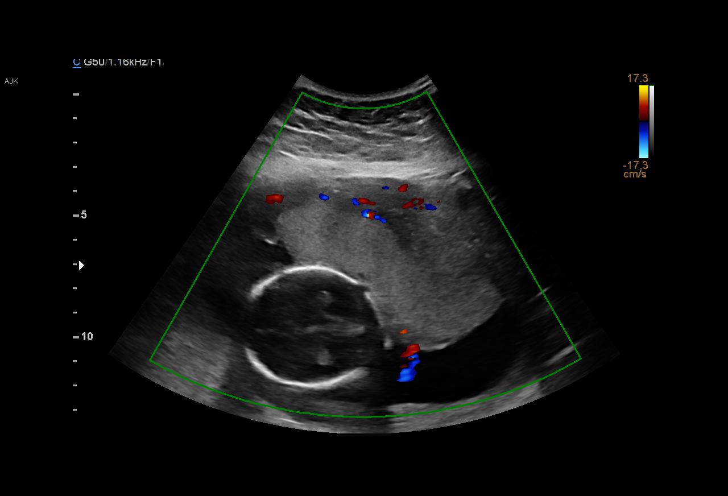
[im 12/20]
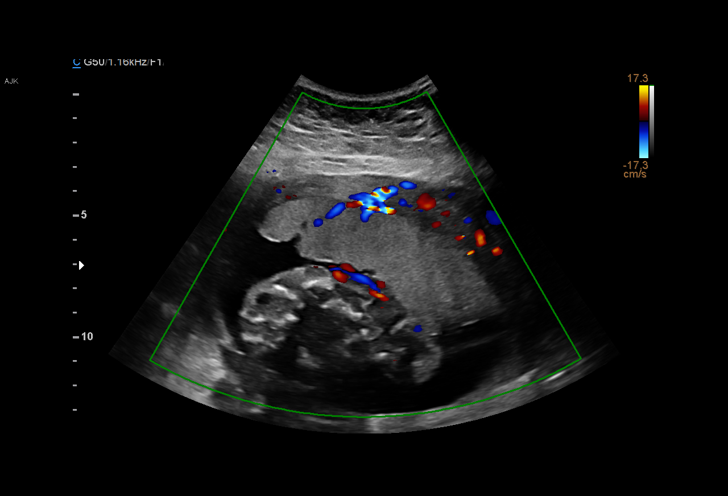
[im 13/20]
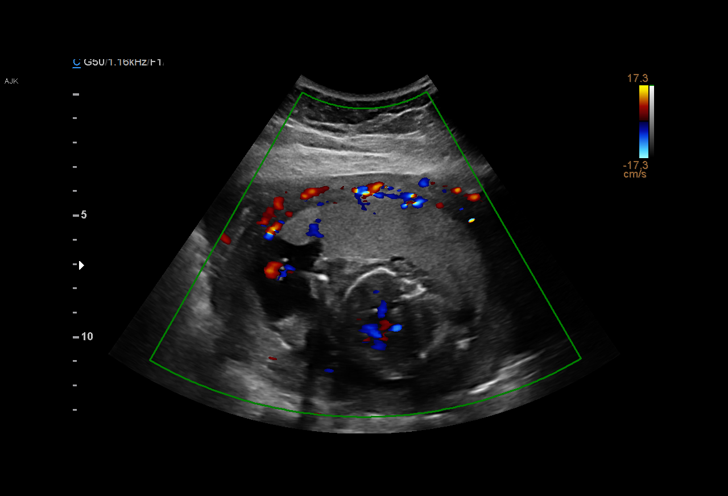
[im 15/20]
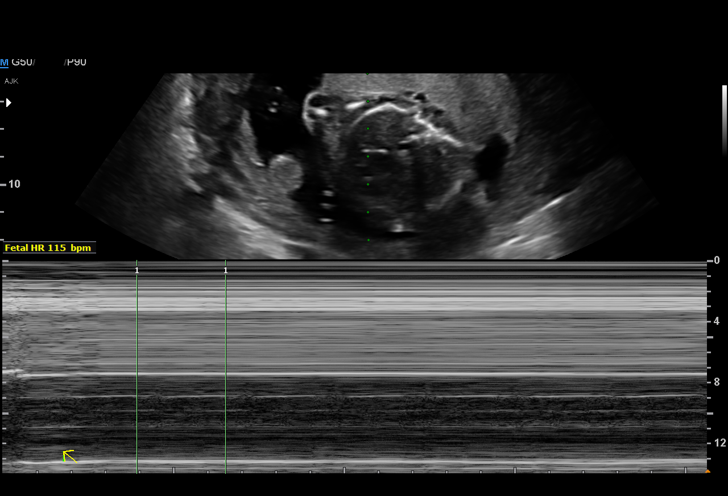
[im 16/20]
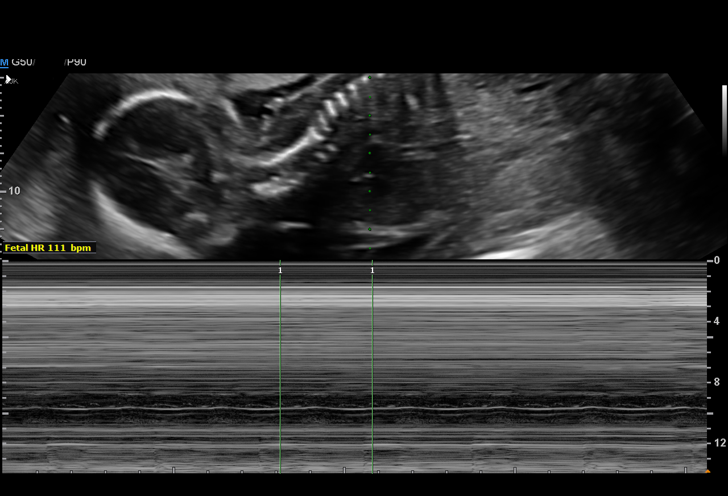
[im 17/20]
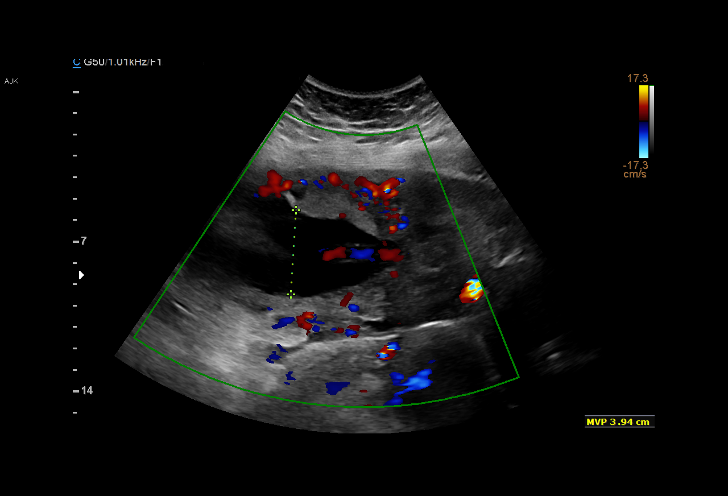
[im 19/20]
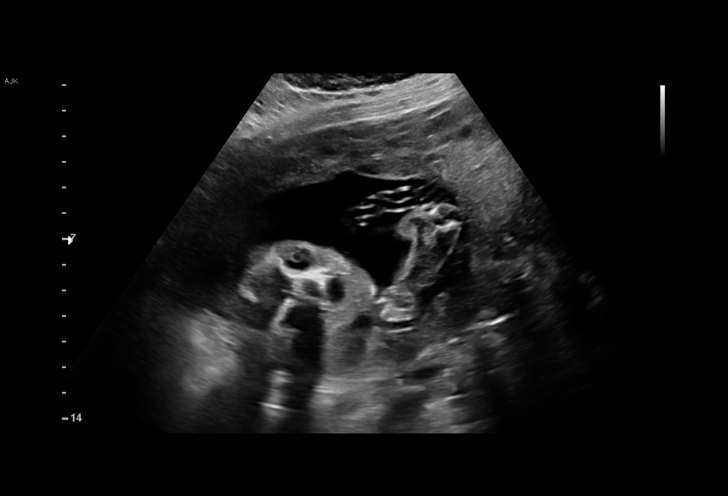
[im 20/20]
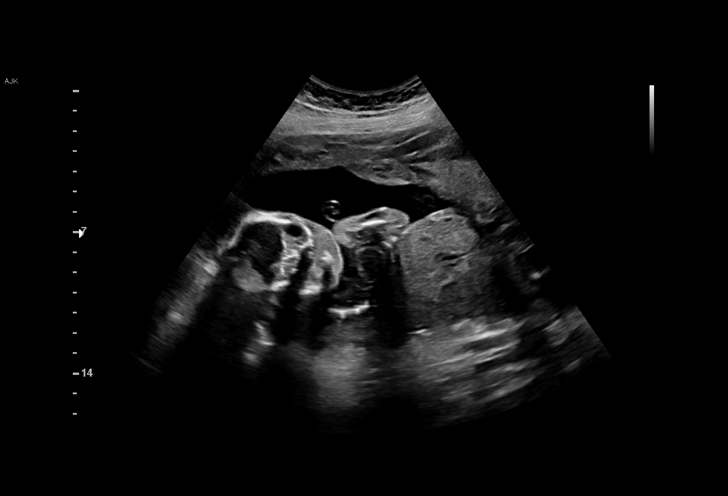

[15 of 20 positions shown; findings below may reference images not displayed]

1  PASARSUKAJADI ROSYADI          [id number]      [id number]     [id number]
Indications

21 weeks gestation of pregnancy
Abdominal pain in pregnancy
Cervical incompetence, second trimester
OB History

Gravidity:    2         Term:   1
Living:       1
Fetal Evaluation

Num Of Fetuses:     1
Fetal Heart         115
Rate(bpm):
Cardiac Activity:   Observed
Presentation:       Breech
Placenta:           Anterior, above cervical os
P. Cord Insertion:  Visualized, central

Amniotic Fluid
AFI FV:      Subjectively within normal limits

Largest Pocket(cm)
3.94
Gestational Age

Clinical EDD:  21w 1d                                        EDD:   02/23/17
Best:          21w 1d    Det. By:   Clinical EDD             EDD:   02/23/17
Cervix Uterus Adnexa

Cervix
Funnel Length:     7.68  cm.
Funnel Width:     7.56  cm.
Hourglass membranes into the vagina.

Uterus
No abnormality visualized.

Left Ovary
Not visualized.

Right Ovary
Not visualized.

Adnexa:       No abnormality visualized.
Impression

Membranes are now prolapsed into the vagina with fetal parts
beyond external os
Otherwise no change from US earlier today
Recommendations

Continue clinical evaluation and management

## 2018-10-12 ENCOUNTER — Encounter: Payer: Self-pay | Admitting: Certified Nurse Midwife

## 2018-12-31 ENCOUNTER — Other Ambulatory Visit: Payer: Self-pay

## 2018-12-31 DIAGNOSIS — Z20822 Contact with and (suspected) exposure to covid-19: Secondary | ICD-10-CM

## 2019-01-01 LAB — NOVEL CORONAVIRUS, NAA: SARS-CoV-2, NAA: DETECTED — AB

## 2019-05-14 ENCOUNTER — Encounter: Payer: Self-pay | Admitting: Certified Nurse Midwife

## 2019-09-28 ENCOUNTER — Ambulatory Visit: Payer: BLUE CROSS/BLUE SHIELD | Admitting: Certified Nurse Midwife

## 2021-11-02 DIAGNOSIS — Z111 Encounter for screening for respiratory tuberculosis: Secondary | ICD-10-CM | POA: Diagnosis not present

## 2021-11-02 DIAGNOSIS — Z23 Encounter for immunization: Secondary | ICD-10-CM | POA: Diagnosis not present

## 2021-11-04 DIAGNOSIS — Z681 Body mass index (BMI) 19 or less, adult: Secondary | ICD-10-CM | POA: Diagnosis not present

## 2021-11-04 DIAGNOSIS — Z111 Encounter for screening for respiratory tuberculosis: Secondary | ICD-10-CM | POA: Diagnosis not present

## 2022-08-21 DIAGNOSIS — N888 Other specified noninflammatory disorders of cervix uteri: Secondary | ICD-10-CM | POA: Diagnosis not present

## 2022-08-21 DIAGNOSIS — N83201 Unspecified ovarian cyst, right side: Secondary | ICD-10-CM | POA: Diagnosis not present

## 2022-08-21 DIAGNOSIS — R110 Nausea: Secondary | ICD-10-CM | POA: Diagnosis not present

## 2022-08-21 DIAGNOSIS — R102 Pelvic and perineal pain: Secondary | ICD-10-CM | POA: Diagnosis not present

## 2022-08-21 DIAGNOSIS — N939 Abnormal uterine and vaginal bleeding, unspecified: Secondary | ICD-10-CM | POA: Diagnosis not present

## 2022-08-21 DIAGNOSIS — M1712 Unilateral primary osteoarthritis, left knee: Secondary | ICD-10-CM | POA: Diagnosis not present

## 2022-08-21 DIAGNOSIS — Z01810 Encounter for preprocedural cardiovascular examination: Secondary | ICD-10-CM | POA: Diagnosis not present

## 2022-11-04 DIAGNOSIS — Z111 Encounter for screening for respiratory tuberculosis: Secondary | ICD-10-CM | POA: Diagnosis not present

## 2022-11-04 DIAGNOSIS — Z23 Encounter for immunization: Secondary | ICD-10-CM | POA: Diagnosis not present

## 2022-11-06 DIAGNOSIS — Z681 Body mass index (BMI) 19 or less, adult: Secondary | ICD-10-CM | POA: Diagnosis not present

## 2022-11-06 DIAGNOSIS — Z111 Encounter for screening for respiratory tuberculosis: Secondary | ICD-10-CM | POA: Diagnosis not present

## 2022-12-06 DIAGNOSIS — R112 Nausea with vomiting, unspecified: Secondary | ICD-10-CM | POA: Diagnosis not present

## 2022-12-17 DIAGNOSIS — K219 Gastro-esophageal reflux disease without esophagitis: Secondary | ICD-10-CM | POA: Diagnosis not present

## 2022-12-24 DIAGNOSIS — R102 Pelvic and perineal pain: Secondary | ICD-10-CM | POA: Diagnosis not present

## 2022-12-31 DIAGNOSIS — N83202 Unspecified ovarian cyst, left side: Secondary | ICD-10-CM | POA: Diagnosis not present

## 2022-12-31 DIAGNOSIS — R7303 Prediabetes: Secondary | ICD-10-CM | POA: Diagnosis not present

## 2022-12-31 DIAGNOSIS — R102 Pelvic and perineal pain: Secondary | ICD-10-CM | POA: Diagnosis not present

## 2023-01-03 DIAGNOSIS — Z6826 Body mass index (BMI) 26.0-26.9, adult: Secondary | ICD-10-CM | POA: Diagnosis not present

## 2023-01-03 DIAGNOSIS — R7303 Prediabetes: Secondary | ICD-10-CM | POA: Diagnosis not present

## 2023-01-03 DIAGNOSIS — K219 Gastro-esophageal reflux disease without esophagitis: Secondary | ICD-10-CM | POA: Diagnosis not present
